# Patient Record
Sex: Male | Born: 1950 | Race: White | Hispanic: No | Marital: Married | Smoking: Former smoker
Health system: Southern US, Community
[De-identification: ages and names within clinical notes are randomized; demographics above are authoritative.]

## PROBLEM LIST (undated history)

## (undated) DIAGNOSIS — A4902 Methicillin resistant Staphylococcus aureus infection, unspecified site: Secondary | ICD-10-CM

## (undated) DIAGNOSIS — I10 Essential (primary) hypertension: Secondary | ICD-10-CM

## (undated) DIAGNOSIS — E785 Hyperlipidemia, unspecified: Secondary | ICD-10-CM

## (undated) DIAGNOSIS — K219 Gastro-esophageal reflux disease without esophagitis: Secondary | ICD-10-CM

## (undated) DIAGNOSIS — F419 Anxiety disorder, unspecified: Secondary | ICD-10-CM

## (undated) DIAGNOSIS — M8580 Other specified disorders of bone density and structure, unspecified site: Secondary | ICD-10-CM

## (undated) DIAGNOSIS — E291 Testicular hypofunction: Secondary | ICD-10-CM

## (undated) HISTORY — DX: Hyperlipidemia, unspecified: E78.5

## (undated) HISTORY — DX: Methicillin resistant Staphylococcus aureus infection, unspecified site: A49.02

## (undated) HISTORY — DX: Anxiety disorder, unspecified: F41.9

## (undated) HISTORY — DX: Gastro-esophageal reflux disease without esophagitis: K21.9

## (undated) HISTORY — DX: Essential (primary) hypertension: I10

## (undated) HISTORY — DX: Other specified disorders of bone density and structure, unspecified site: M85.80

## (undated) HISTORY — PX: OTHER SURGICAL HISTORY: SHX169

## (undated) HISTORY — DX: Testicular hypofunction: E29.1

---

## 2000-12-04 ENCOUNTER — Ambulatory Visit (HOSPITAL_COMMUNITY): Admission: RE | Admit: 2000-12-04 | Discharge: 2000-12-04 | Payer: Self-pay | Admitting: Neurosurgery

## 2000-12-04 ENCOUNTER — Encounter: Payer: Self-pay | Admitting: Neurosurgery

## 2000-12-16 ENCOUNTER — Encounter: Admission: RE | Admit: 2000-12-16 | Discharge: 2001-03-16 | Payer: Self-pay | Admitting: Anesthesiology

## 2001-04-07 ENCOUNTER — Encounter: Admission: RE | Admit: 2001-04-07 | Discharge: 2001-07-06 | Payer: Self-pay | Admitting: Anesthesiology

## 2005-06-02 ENCOUNTER — Ambulatory Visit: Payer: Self-pay | Admitting: Orthopedic Surgery

## 2005-12-31 ENCOUNTER — Ambulatory Visit: Payer: Self-pay | Admitting: Orthopedic Surgery

## 2006-01-08 ENCOUNTER — Ambulatory Visit: Payer: Self-pay | Admitting: Orthopedic Surgery

## 2006-01-15 ENCOUNTER — Ambulatory Visit: Payer: Self-pay | Admitting: Orthopedic Surgery

## 2007-02-08 ENCOUNTER — Ambulatory Visit: Payer: Self-pay | Admitting: Internal Medicine

## 2007-02-15 ENCOUNTER — Ambulatory Visit: Payer: Self-pay | Admitting: Internal Medicine

## 2007-03-25 ENCOUNTER — Ambulatory Visit (HOSPITAL_COMMUNITY): Admission: RE | Admit: 2007-03-25 | Discharge: 2007-03-25 | Payer: Self-pay | Admitting: Internal Medicine

## 2007-03-31 ENCOUNTER — Ambulatory Visit: Payer: Self-pay | Admitting: Internal Medicine

## 2009-01-26 ENCOUNTER — Encounter: Payer: Self-pay | Admitting: Internal Medicine

## 2010-12-06 ENCOUNTER — Emergency Department (HOSPITAL_COMMUNITY)
Admission: EM | Admit: 2010-12-06 | Discharge: 2010-12-06 | Payer: Self-pay | Source: Home / Self Care | Admitting: Emergency Medicine

## 2010-12-08 ENCOUNTER — Emergency Department (HOSPITAL_COMMUNITY)
Admission: EM | Admit: 2010-12-08 | Discharge: 2010-12-08 | Payer: Self-pay | Source: Home / Self Care | Admitting: Emergency Medicine

## 2010-12-10 ENCOUNTER — Emergency Department (HOSPITAL_COMMUNITY)
Admission: EM | Admit: 2010-12-10 | Discharge: 2010-12-10 | Payer: Self-pay | Source: Home / Self Care | Admitting: Emergency Medicine

## 2010-12-10 LAB — CULTURE, ROUTINE-ABSCESS

## 2011-04-04 NOTE — H&P (Signed)
[facility]  Patient:    Robert Davidson, Robert Davidson                          MRN: [id number] Adm. Date:  [id number] Attending:  Thyra Breed CC:         Dr. Viviann Spare ________   History and Physical  FOLLOW-UP EVALUATION:  Eaton Folmar comes in for follow-up evaluation of his right infrascapular pain with underlying history of thoracic degenerative disk disease. Since his last evaluation, we have gotten his pain under fairly good control at rest at 2/10. With activity, he has increasing discomfort.  He has had a dickens of a time getting his disability papers filled out. Dr. ______ put him on disability but would not fill out his papers subsequent to this. Donalee Citrin, Montez Hageman. who had sent him to [place] would not fill out the forms then. The patient was made aware that it is our policy not to fill out forms; but since he basically will not get paid if somebody does not do it for him, I would fill out this one time. He really needs to have the person who put him on disability filling out the forms, i.e. Dr. ______ . I explained this to him.  PHYSICAL EXAMINATION:  VITAL SIGNS:  Blood pressure is 138/80, heart rate is 87, respiratory rate is 18, O2 saturation is 98%, pain level is 2/10.  NEUROLOGICAL:  He has pain in the right infrascapular region to palpation. Deep tendon reflexes are symmetric in the upper extremities.  CURRENT MEDICATIONS: 1. OxyContin 20 mg q.8h. 2. Zestril and Wellbutrin.  IMPRESSION: 1. Right infrascapular pain with underlying thoracic degenerative disk disease    improved at rest but with pain with activity. 2. Hypertension per Dr. ______ .  DISPOSITION: 1. Increase OxyContin to 40 mg one p.o. q.12h., #60 with no refill. 2. I filled out his disability forms today and advised him that he needs to    have these subsequently filled out by Dr. ______ and would be happy to    send him a copy of our results and let him know when we feel like we are  getting improvements. 3. Continue medications for blood pressure per Dr. ______ . 4. Follow up with me in 8 weeks. He is to stop by in 3 weeks for another    prescription of his OxyContin. DD:  02/11/01 TD:  02/11/01 Job: [id number]
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
## 2011-04-04 NOTE — H&P (Signed)
[facility]  Patient:    Robert Davidson, Robert Davidson                          MRN: [id number] Adm. Date:  [id number] Attending:  Thyra Breed CC:         Dr. Sheran Lawless., M.D.   History and Physical  FOLLOWUP EVALUATION  HISTORY OF PRESENT ILLNESS:  Robert Davidson comes in for follow-up evaluation of his right infrascapular pain syndrome.  Since his last evaluation he has returned to work and notes that the medicine lasts until about 2 oclock, and he is really having to crank up his TENS unit.  He is able to continue with work. He rates his pain 3/10, but he notes he breaks through fairly significantly late in the afternoon.  He has had minimal side effects of the medication.  CURRENT MEDICATIONS: 1. Zestril. 2. Wellbutrin. 3. OxyContin 40 mg 2 x q.d.  PHYSICAL EXAMINATION:  VITAL SIGNS:  Blood pressure 137/98, heart rate 105, respiratory rate 16, O2 saturation 98%.  Pain level 3/10.  NEUROLOGIC:  Deep tendon reflexes were symmetric in the upper extremity.  He has tenderness along the medial aspect of the right scapula.  IMPRESSION: 1. Right infrascapular pain with underlying thoracic degenerative disc    disease. 2. Hypertension per Dr. Metta Clines.  DISPOSITION: 1. Increase OxyContin to 40 mg 1 p.o. q.8h., #90 with no refill. 2. Followup with me in eight weeks. 3. Continue with blood pressure medications per Dr. Metta Clines.  He is to let us    know if he is intolerant of the medications between now and his next    appointment.  I encouraged him to keep using the TENS unit since he is    having a very positive response to this. DD:  04/08/01 TD:  04/08/01 Job: [id number]

## 2011-04-04 NOTE — H&P (Signed)
[facility]  Patient:    Robert Davidson, Robert Davidson                          MRN: [id number] Adm. Date:  [id number] Attending:  Thyra Breed CC:         Donzetta Sprung. Roney Jaffe., M.D.  John Giovanni, M.D.   History and Physical  FOLLOWUP EVALUATION:  The patient comes in for followup evaluation of his thoracic spine discomfort.  Since his previous evaluation, the patient has noted that his pain is less unless he is active.  He is tolerating the OxyContin 10 mg q.8h. without difficulty.  He notes minimal side-effects to the medication.  Unfortunately, if he picks up his granddaughter, his pain is exacerbated.  He has no pain that radiates around into the anterior chest wall; his pain is localized to the right paravertebral region just medial to the distal aspect of the scapula at approximately the T8 line.  He is very curious about endpoints and I told him I could not predict things at this point.  His pain character is unchanged; it is more of a toothache-type discomfort.  CURRENT MEDICATIONS:  Zestril, Valium, Wellbutrin and OxyContin.  PHYSICAL EXAMINATION  VITAL SIGNS:  Blood pressure is 121/84, heart rate is 100, respiratory rate is 18, O2 saturation is 97% and pain level is 4/10.  CHEST:  He demonstrates a localized pain to the right medial scapular region with no radiation anteriorly.  Sensory exam to scratch sense and pinprick are intact.  Lungs are clear.  IMPRESSION:  Right infrascapular discomfort with an underlying thoracic degenerative disk disease.  DISPOSITION 1. Increase OxyContin to 20 mg 1 p.o. b.i.d., #60 with no refill. 2. Trial of TENS unit, prescription written for this. 3. Follow up with me in four weeks.  If he is not continuing to improve, I    advised him that we could consider an epidural steroid injection, but for    the time-being, I wish to try medical management. DD:  01/14/01 TD:  01/14/01 Job: [id number]

## 2011-04-04 NOTE — Assessment & Plan Note (Signed)
[facility]                         GASTROENTEROLOGY OFFICE NOTE   CAMDEN, KNOTEK                          MRN:          [id number]  DATE:02/15/2007                            DOB:          Aug 23, 1951    Mr. Mella is a 60 year old gentleman who is scheduled for a screening  colonoscopy for this week, April 3, in [facility] using conscious sedation.  The reason for his being seen in the office today is his concern for  sedation.  Mr. Chillemi has been on OxyContin 80 mg three times a day for  chronic back pain.  He is concerned that he will not be able to be  sedated because his pain threshold has been altered.   I have talked to the patient extensively today and it would be  preferable to do his colonoscopy under propofol anesthetic.  This will  have to be done in the hospital as an outpatient most likely at [facility] and the earliest possible date will be in May.  The patient was agreeable to that and we have scheduled him for propofol  colonoscopy in May of 2008.     Hedwig Morton. Juanda Chance, MD  Electronically Signed    DMB/MedQ  DD: 02/15/2007  DT: 02/15/2007  Job #: [id number]

## 2011-04-04 NOTE — H&P (Signed)
[facility]  Patient:    Robert Davidson, Robert Davidson                          MRN: [id number] Adm. Date:  [id number] Attending:  Thyra Breed CC:         Donzetta Sprung. Roney Jaffe., M.D.  Dr. Sudie Bailey   History and Physical  FOLLOW-UP EVALUATION  [name] comes in for follow-up evaluation of his thoracic degenerative disk disease and right infrascapular pain.  Since his last evaluation, the patient has been back at work and he notes that he is able to work, but on certain jobs, he has some exacerbations of his pain.  He does feel as though the OxyContin 40 mg three times a day is helpful.  He has not noted any sedation or limits from this.  He does feel as though he may benefit from going up on the dose of this.  He feels pressured at work, as though they are trying to push him out of work at the present time.  PHYSICAL EXAMINATION:  VITAL SIGNS:  Blood pressure 141/87, heart rate is 97, respiratory rate is 18, O2 saturations 97%, pain level is 2/10.  EXTREMITIES/NEUROLOGIC:  Deep tendon reflexes were symmetric in the upper and lower extremities.  He exhibited tenderness along the medial aspect of the right scapula.  IMPRESSION: 1. Right infrascapular pain with underlying thoracic degenerative disk    disease. 2. Hypertension per Dr. Sudie Bailey.  DISPOSITION: 1. Increase OxyContin to 40 mg one in the morning and one at midday and two in    the evening, #120 with no refill. 2. The patient apparently is being denied approval of his TENS unit through    [facility].  I advised the patient that it appears to me that    if he did not have the TENS unit, he would not be able to work at all, as    it has significantly reduced his discomfort, and I do not understand the    question as to whether it should be approved or not.  I advised him I would    write a letter in support of this. 3. Follow up with me in 4-8 weeks. DD:  06/03/01 TD:  06/03/01 Job:  [id number]

## 2011-12-25 DIAGNOSIS — Z79899 Other long term (current) drug therapy: Secondary | ICD-10-CM | POA: Diagnosis not present

## 2011-12-25 DIAGNOSIS — M546 Pain in thoracic spine: Secondary | ICD-10-CM | POA: Diagnosis not present

## 2011-12-25 DIAGNOSIS — M542 Cervicalgia: Secondary | ICD-10-CM | POA: Diagnosis not present

## 2011-12-25 DIAGNOSIS — G541 Lumbosacral plexus disorders: Secondary | ICD-10-CM | POA: Diagnosis not present

## 2011-12-25 DIAGNOSIS — F341 Dysthymic disorder: Secondary | ICD-10-CM | POA: Diagnosis not present

## 2012-01-29 DIAGNOSIS — L03211 Cellulitis of face: Secondary | ICD-10-CM | POA: Diagnosis not present

## 2012-01-29 DIAGNOSIS — L0201 Cutaneous abscess of face: Secondary | ICD-10-CM | POA: Diagnosis not present

## 2012-01-30 DIAGNOSIS — F329 Major depressive disorder, single episode, unspecified: Secondary | ICD-10-CM | POA: Insufficient documentation

## 2012-01-30 DIAGNOSIS — M549 Dorsalgia, unspecified: Secondary | ICD-10-CM

## 2012-01-30 DIAGNOSIS — I10 Essential (primary) hypertension: Secondary | ICD-10-CM | POA: Insufficient documentation

## 2012-02-02 ENCOUNTER — Encounter: Payer: Self-pay | Admitting: Infectious Disease

## 2012-02-02 ENCOUNTER — Ambulatory Visit (INDEPENDENT_AMBULATORY_CARE_PROVIDER_SITE_OTHER): Payer: BC Managed Care – PPO | Admitting: Infectious Disease

## 2012-02-02 VITALS — BP 138/89 | HR 112 | Temp 97.9°F | Wt 205.0 lb

## 2012-02-02 DIAGNOSIS — E291 Testicular hypofunction: Secondary | ICD-10-CM | POA: Diagnosis not present

## 2012-02-02 DIAGNOSIS — A4902 Methicillin resistant Staphylococcus aureus infection, unspecified site: Secondary | ICD-10-CM | POA: Diagnosis not present

## 2012-02-02 DIAGNOSIS — M810 Age-related osteoporosis without current pathological fracture: Secondary | ICD-10-CM | POA: Insufficient documentation

## 2012-02-02 DIAGNOSIS — I10 Essential (primary) hypertension: Secondary | ICD-10-CM | POA: Insufficient documentation

## 2012-02-02 LAB — HIV ANTIBODY (ROUTINE TESTING W REFLEX): HIV: NONREACTIVE

## 2012-02-02 MED ORDER — MUPIROCIN 2 % EX OINT: TOPICAL_OINTMENT | CUTANEOUS | Status: AC

## 2012-02-02 MED ORDER — CHLORHEXIDINE GLUCONATE 4 % EX LIQD: 60.0000 mL | Freq: Every day | CUTANEOUS | Status: AC | PRN

## 2012-02-02 NOTE — Progress Notes (Signed)
Subjective:    Patient ID: Robert Davidson, male    DOB: 06/20/1951, 61 y.o.   MRN: [id number]  HPI  61 year old man with hx of osteopenia, hypogonadism, hyperlipidemia who has had recurrence of MRSA (3 episodes). First episode was in perineal area and required I and D in ED at [facility]. He had then boil on scalp I and D at PCP with MRSA growing, and now again within the past 2 weeks. He has been shaving his hair with electric razor in region where the past 2 boils have come up. He also has noticed plugging of hair follicles in weeks that he takes the testosterone. He has tried hibiclenz baths with IN mupirocin. He has no current sexual partners and has not been sexual active for 10 years. He has no hx of recurrent childhood infections of skin or soft tissue or sinus infection. I have recommended that he do bleach baths plus IN mupirocin but he is reluctant to try this preferring to go back to hibiclenz and mupirocin first (he also tried rifampin but did not tolerate this. He has been rx surfaces with chlorox. His dog has not been thru decolonization regimen yet. I have also asked him to make sure that his razor blade guides are sterile before using them and to consider purchase of new electric razor head and reduce frequency of shaving      Review of Systems  Constitutional: Negative for fever, chills, diaphoresis, activity change, appetite change, fatigue and unexpected weight change.  HENT: Negative for congestion, sore throat, rhinorrhea, sneezing, trouble swallowing and sinus pressure.   Eyes: Negative for photophobia and visual disturbance.  Respiratory: Negative for cough, chest tightness, shortness of breath, wheezing and stridor.   Cardiovascular: Negative for chest pain, palpitations and leg swelling.  Gastrointestinal: Negative for nausea, vomiting, abdominal pain, diarrhea, constipation, blood in stool, abdominal distention and anal bleeding.  Genitourinary: Negative for dysuria, hematuria,  flank pain and difficulty urinating.  Musculoskeletal: Negative for myalgias, back pain, joint swelling, arthralgias and gait problem.  Skin: Positive for wound. Negative for color change, pallor and rash.  Neurological: Negative for dizziness, tremors, weakness and light-headedness.  Hematological: Negative for adenopathy. Does not bruise/bleed easily.  Psychiatric/Behavioral: Negative for behavioral problems, confusion, sleep disturbance, dysphoric mood, decreased concentration and agitation.       Objective:   Physical Exam  Constitutional: He is oriented to person, place, and time. He appears well-developed and well-nourished. No distress.  HENT:  Head: Normocephalic and atraumatic.    Mouth/Throat: Oropharynx is clear and moist. No oropharyngeal exudate.  Eyes: Conjunctivae and EOM are normal. Pupils are equal, round, and reactive to light. No scleral icterus.  Neck: Normal range of motion. Neck supple. No JVD present.  Cardiovascular: Normal rate, regular rhythm and normal heart sounds.  Exam reveals no gallop and no friction rub.   No murmur heard. Pulmonary/Chest: Effort normal and breath sounds normal. No respiratory distress. He has no wheezes. He has no rales. He exhibits no tenderness.  Abdominal: He exhibits no distension and no mass. There is no tenderness. There is no rebound and no guarding.  Musculoskeletal: He exhibits no edema and no tenderness.  Lymphadenopathy:    He has no cervical adenopathy.  Neurological: He is alert and oriented to person, place, and time. He has normal reflexes. He exhibits normal muscle tone. Coordination normal.  Skin: Skin is warm and dry. He is not diaphoretic. There is erythema. No pallor.  Psychiatric: He has  a normal mood and affect. His behavior is normal. Judgment and thought content normal.          Assessment & Plan:  MRSA infection Finish current oral abx. Try to reduce frequency of shaving, get sterile shaving guider,  replace heads, try repeat hibiclenz and mupirocin. Should really give more thought to bleach baths and even consider hibiclenz the dog too. RTC in 3 months. Will screen for HIV. No reason to suspect other heritable or acquired immune problems given hx.  Hypogonadism male Suggested other options for administration of theis med but he feels the injectablve means is best for him

## 2012-02-02 NOTE — Assessment & Plan Note (Signed)
Suggested other options for administration of theis med but he feels the injectablve means is best for him

## 2012-02-02 NOTE — Assessment & Plan Note (Addendum)
Finish current oral abx. Try to reduce frequency of shaving, get sterile shaving guider, replace heads, try repeat hibiclenz and mupirocin. Should really give more thought to bleach baths and even consider hibiclenz the dog too. RTC in 3 months. Will screen for HIV. No reason to suspect other heritable or acquired immune problems given hx.

## 2012-02-18 DIAGNOSIS — G541 Lumbosacral plexus disorders: Secondary | ICD-10-CM | POA: Diagnosis not present

## 2012-02-18 DIAGNOSIS — M546 Pain in thoracic spine: Secondary | ICD-10-CM | POA: Diagnosis not present

## 2012-02-18 DIAGNOSIS — Z79899 Other long term (current) drug therapy: Secondary | ICD-10-CM | POA: Diagnosis not present

## 2012-02-18 DIAGNOSIS — F341 Dysthymic disorder: Secondary | ICD-10-CM | POA: Diagnosis not present

## 2012-02-28 ENCOUNTER — Encounter (HOSPITAL_COMMUNITY): Payer: Self-pay

## 2012-02-28 ENCOUNTER — Emergency Department (HOSPITAL_COMMUNITY): Payer: Medicare Other

## 2012-02-28 ENCOUNTER — Emergency Department (HOSPITAL_COMMUNITY)
Admission: EM | Admit: 2012-02-28 | Discharge: 2012-02-28 | Disposition: A | Discharge status: Home / Self Care | Payer: Medicare Other | Attending: Emergency Medicine | Admitting: Emergency Medicine

## 2012-02-28 DIAGNOSIS — S300XXA Contusion of lower back and pelvis, initial encounter: Secondary | ICD-10-CM | POA: Diagnosis not present

## 2012-02-28 DIAGNOSIS — R079 Chest pain, unspecified: Secondary | ICD-10-CM | POA: Diagnosis not present

## 2012-02-28 DIAGNOSIS — R221 Localized swelling, mass and lump, neck: Secondary | ICD-10-CM | POA: Diagnosis not present

## 2012-02-28 DIAGNOSIS — M810 Age-related osteoporosis without current pathological fracture: Secondary | ICD-10-CM | POA: Diagnosis not present

## 2012-02-28 DIAGNOSIS — S2239XA Fracture of one rib, unspecified side, initial encounter for closed fracture: Secondary | ICD-10-CM | POA: Diagnosis not present

## 2012-02-28 DIAGNOSIS — IMO0002 Reserved for concepts with insufficient information to code with codable children: Secondary | ICD-10-CM | POA: Insufficient documentation

## 2012-02-28 DIAGNOSIS — M47812 Spondylosis without myelopathy or radiculopathy, cervical region: Secondary | ICD-10-CM | POA: Diagnosis not present

## 2012-02-28 DIAGNOSIS — R109 Unspecified abdominal pain: Secondary | ICD-10-CM | POA: Diagnosis not present

## 2012-02-28 DIAGNOSIS — Q619 Cystic kidney disease, unspecified: Secondary | ICD-10-CM | POA: Diagnosis not present

## 2012-02-28 DIAGNOSIS — T07XXXA Unspecified multiple injuries, initial encounter: Secondary | ICD-10-CM

## 2012-02-28 DIAGNOSIS — R220 Localized swelling, mass and lump, head: Secondary | ICD-10-CM | POA: Insufficient documentation

## 2012-02-28 DIAGNOSIS — R10812 Left upper quadrant abdominal tenderness: Secondary | ICD-10-CM | POA: Diagnosis not present

## 2012-02-28 DIAGNOSIS — Z79899 Other long term (current) drug therapy: Secondary | ICD-10-CM | POA: Diagnosis not present

## 2012-02-28 DIAGNOSIS — S298XXA Other specified injuries of thorax, initial encounter: Secondary | ICD-10-CM | POA: Diagnosis not present

## 2012-02-28 DIAGNOSIS — R071 Chest pain on breathing: Secondary | ICD-10-CM | POA: Diagnosis not present

## 2012-02-28 DIAGNOSIS — M25529 Pain in unspecified elbow: Secondary | ICD-10-CM | POA: Insufficient documentation

## 2012-02-28 DIAGNOSIS — S2231XA Fracture of one rib, right side, initial encounter for closed fracture: Secondary | ICD-10-CM

## 2012-02-28 DIAGNOSIS — I10 Essential (primary) hypertension: Secondary | ICD-10-CM | POA: Diagnosis not present

## 2012-02-28 DIAGNOSIS — T1490XA Injury, unspecified, initial encounter: Secondary | ICD-10-CM | POA: Diagnosis not present

## 2012-02-28 LAB — RAPID URINE DRUG SCREEN, HOSP PERFORMED
Amphetamines: NOT DETECTED
Barbiturates: NOT DETECTED
Benzodiazepines: POSITIVE — AB
Cocaine: NOT DETECTED
Opiates: NOT DETECTED
Tetrahydrocannabinol: NOT DETECTED

## 2012-02-28 LAB — TYPE AND SCREEN
ABO/RH(D): B POS
Antibody Screen: NEGATIVE

## 2012-02-28 LAB — URINALYSIS, ROUTINE W REFLEX MICROSCOPIC
Bilirubin Urine: NEGATIVE
Glucose, UA: NEGATIVE mg/dL
Hgb urine dipstick: NEGATIVE
Ketones, ur: NEGATIVE mg/dL
Nitrite: NEGATIVE
Protein, ur: NEGATIVE mg/dL
Specific Gravity, Urine: 1.018 (ref 1.005–1.030)
Urobilinogen, UA: 0.2 mg/dL (ref 0.0–1.0)
pH: 7 (ref 5.0–8.0)

## 2012-02-28 LAB — CBC
HCT: 48.8 % (ref 39.0–52.0)
Hemoglobin: 16.7 g/dL (ref 13.0–17.0)
MCH: 28.4 pg (ref 26.0–34.0)
MCHC: 34.2 g/dL (ref 30.0–36.0)
MCV: 83.1 fL (ref 78.0–100.0)
Platelets: 215 10*3/uL (ref 150–400)
RBC: 5.87 MIL/uL — ABNORMAL HIGH (ref 4.22–5.81)
RDW: 12.6 % (ref 11.5–15.5)
WBC: 12 10*3/uL — ABNORMAL HIGH (ref 4.0–10.5)

## 2012-02-28 LAB — ETHANOL: Alcohol, Ethyl (B): <11 mg/dL (ref 0–11)

## 2012-02-28 LAB — PROTIME-INR
INR: 1.03 (ref 0.00–1.49)
Prothrombin Time: 13.7 seconds (ref 11.6–15.2)

## 2012-02-28 LAB — ABO/RH: ABO/RH(D): B POS

## 2012-02-28 LAB — URINE MICROSCOPIC-ADD ON

## 2012-02-28 LAB — LACTIC ACID, PLASMA: Lactic Acid, Venous: 0.9 mmol/L (ref 0.5–2.2)

## 2012-02-28 MED ORDER — TETANUS-DIPHTH-ACELL PERTUSSIS 5-2.5-18.5 LF-MCG/0.5 IM SUSP
0.5000 mL | Freq: Once | INTRAMUSCULAR | Status: AC
  Administered 2012-02-28: 0.5 mL via INTRAMUSCULAR
  Filled 2012-02-28: qty 0.5

## 2012-02-28 MED ORDER — IOHEXOL 300 MG/ML  SOLN
100.0000 mL | Freq: Once | INTRAMUSCULAR | Status: AC | PRN
  Administered 2012-02-28: 100 mL via INTRAVENOUS

## 2012-02-28 MED ORDER — TETANUS-DIPHTH-ACELL PERTUSSIS 5-2.5-18.5 LF-MCG/0.5 IM SUSP: 0.5000 mL | Freq: Once | INTRAMUSCULAR | Status: DC

## 2012-02-28 MED ORDER — HYDROMORPHONE HCL PF 1 MG/ML IJ SOLN
INTRAMUSCULAR | Status: AC
  Administered 2012-02-28: 1 mg via INTRAVENOUS
  Filled 2012-02-28: qty 1

## 2012-02-28 MED ORDER — ONDANSETRON HCL 4 MG/2ML IJ SOLN
4.0000 mg | Freq: Once | INTRAMUSCULAR | Status: AC
  Administered 2012-02-28: 4 mg via INTRAVENOUS
  Filled 2012-02-28: qty 2

## 2012-02-28 MED ORDER — HYDROMORPHONE HCL PF 1 MG/ML IJ SOLN: 1.0000 mg | Freq: Once | INTRAMUSCULAR | Status: DC

## 2012-02-28 MED ORDER — HYDROMORPHONE HCL PF 1 MG/ML IJ SOLN
INTRAMUSCULAR | Status: AC
  Filled 2012-02-28: qty 1

## 2012-02-28 MED ORDER — OXYCODONE HCL 5 MG PO TABS: 5.0000 mg | ORAL_TABLET | ORAL | Status: DC | PRN

## 2012-02-28 MED ORDER — SODIUM CHLORIDE 0.9 % IV BOLUS (SEPSIS): 1000.0000 mL | Freq: Once | INTRAVENOUS | Status: DC

## 2012-02-28 MED ORDER — HYDROMORPHONE HCL PF 1 MG/ML IJ SOLN
1.0000 mg | Freq: Once | INTRAMUSCULAR | Status: AC
  Administered 2012-02-28: 1 mg via INTRAVENOUS

## 2012-02-28 MED ORDER — SODIUM CHLORIDE 0.9 % IV SOLN: Freq: Once | INTRAVENOUS | Status: DC

## 2012-02-28 MED ORDER — FENTANYL CITRATE 0.05 MG/ML IJ SOLN
100.0000 ug | Freq: Once | INTRAMUSCULAR | Status: AC
  Administered 2012-02-28: 100 ug via INTRAVENOUS
  Filled 2012-02-28: qty 2

## 2012-02-28 MED ORDER — HYDROMORPHONE HCL PF 1 MG/ML IJ SOLN
1.0000 mg | Freq: Once | INTRAMUSCULAR | Status: AC
Start: 2012-02-28 — End: 2012-02-28
  Administered 2012-02-28: 1 mg via INTRAVENOUS

## 2012-02-28 MED ORDER — MORPHINE SULFATE 4 MG/ML IJ SOLN: 4.0000 mg | Freq: Once | INTRAMUSCULAR | Status: DC

## 2012-02-28 NOTE — ED Notes (Signed)
Patient given incentive spirometer and instructed on how to use it at home.
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
## 2012-02-28 NOTE — ED Notes (Signed)
Received bedside report from [place], [place].  Patient currently lying in bed; no respiratory or acute distress noted.  Patient updated on plan of care; informed patient that we need to clean wounds/abrasions.  Patient requesting to have C-collar removed; resident notified.  Patient has no other questions or concerns at this time; will continue to monitor.

## 2012-02-28 NOTE — ED Notes (Signed)
Pt returned to room 2  After CT and xrays, reports mild pain control with the dilaudid

## 2012-02-28 NOTE — ED Notes (Signed)
Pt to xray and CT

## 2012-02-28 NOTE — ED Notes (Signed)
Patient given copy of discharge paperwork; went over discharge instructions with patient.  Patient instructed to take oxycodone as directed, to not drive while taking pain medication, to follow up with his primary care physician within the next week, to use incentive spirometer at home, and to return to the ED for new, worsening, or concerning symptoms.

## 2012-02-28 NOTE — Progress Notes (Addendum)
Chaplain Note: Chaplain responded immediately to LV 2 trauma page.  Chaplain provided spiritual comfort, support, and prayer for pt.  At pt's request, chaplain notifed pt's emergency contact, informing him that the pt was being treated at [facility] and had requested that I call.  Chaplain will follow up if needed.  Pt expressed appreciation for chaplain support.   02/28/12 1500  Clinical Encounter Type  Visited With Patient  Visit Type Spiritual support;ED  Referral From Other (Comment) (Trauma Page)  Spiritual Encounters  Spiritual Needs Prayer;Emotional  Stress Factors  Patient Stress Factors Health changes  Family Stress Factors Other (Comment) (No family present)    Verdie Shire, chaplain resident [phone]
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
## 2012-03-04 ENCOUNTER — Emergency Department (HOSPITAL_COMMUNITY): Payer: Medicare Other

## 2012-03-04 ENCOUNTER — Inpatient Hospital Stay (HOSPITAL_COMMUNITY)
Admission: EM | Admit: 2012-03-04 | Discharge: 2012-03-08 | DRG: 557 | Disposition: A | Discharge status: Home / Self Care | Payer: Medicare Other | Source: Ambulatory Visit | Attending: Internal Medicine | Admitting: Internal Medicine

## 2012-03-04 ENCOUNTER — Encounter (HOSPITAL_COMMUNITY): Payer: Self-pay | Admitting: *Deleted

## 2012-03-04 DIAGNOSIS — E871 Hypo-osmolality and hyponatremia: Secondary | ICD-10-CM | POA: Diagnosis present

## 2012-03-04 DIAGNOSIS — I6789 Other cerebrovascular disease: Secondary | ICD-10-CM | POA: Diagnosis not present

## 2012-03-04 DIAGNOSIS — G928 Other toxic encephalopathy: Secondary | ICD-10-CM | POA: Diagnosis present

## 2012-03-04 DIAGNOSIS — N179 Acute kidney failure, unspecified: Secondary | ICD-10-CM | POA: Diagnosis present

## 2012-03-04 DIAGNOSIS — G929 Unspecified toxic encephalopathy: Secondary | ICD-10-CM | POA: Diagnosis present

## 2012-03-04 DIAGNOSIS — I10 Essential (primary) hypertension: Secondary | ICD-10-CM | POA: Diagnosis present

## 2012-03-04 DIAGNOSIS — D72829 Elevated white blood cell count, unspecified: Secondary | ICD-10-CM | POA: Diagnosis present

## 2012-03-04 DIAGNOSIS — E291 Testicular hypofunction: Secondary | ICD-10-CM | POA: Diagnosis present

## 2012-03-04 DIAGNOSIS — M810 Age-related osteoporosis without current pathological fracture: Secondary | ICD-10-CM | POA: Diagnosis present

## 2012-03-04 DIAGNOSIS — M6282 Rhabdomyolysis: Principal | ICD-10-CM | POA: Diagnosis present

## 2012-03-04 DIAGNOSIS — S2239XA Fracture of one rib, unspecified side, initial encounter for closed fracture: Secondary | ICD-10-CM | POA: Diagnosis not present

## 2012-03-04 DIAGNOSIS — E876 Hypokalemia: Secondary | ICD-10-CM | POA: Diagnosis not present

## 2012-03-04 DIAGNOSIS — B179 Acute viral hepatitis, unspecified: Secondary | ICD-10-CM | POA: Diagnosis present

## 2012-03-04 DIAGNOSIS — G92 Toxic encephalopathy: Secondary | ICD-10-CM | POA: Diagnosis present

## 2012-03-04 DIAGNOSIS — M549 Dorsalgia, unspecified: Secondary | ICD-10-CM | POA: Diagnosis present

## 2012-03-04 DIAGNOSIS — G8929 Other chronic pain: Secondary | ICD-10-CM | POA: Diagnosis present

## 2012-03-04 DIAGNOSIS — R4182 Altered mental status, unspecified: Secondary | ICD-10-CM | POA: Diagnosis not present

## 2012-03-04 DIAGNOSIS — S3011XA Contusion of abdominal wall, initial encounter: Secondary | ICD-10-CM | POA: Diagnosis not present

## 2012-03-04 DIAGNOSIS — Z79899 Other long term (current) drug therapy: Secondary | ICD-10-CM

## 2012-03-04 DIAGNOSIS — R443 Hallucinations, unspecified: Secondary | ICD-10-CM | POA: Diagnosis not present

## 2012-03-04 DIAGNOSIS — R410 Disorientation, unspecified: Secondary | ICD-10-CM | POA: Diagnosis present

## 2012-03-04 DIAGNOSIS — D649 Anemia, unspecified: Secondary | ICD-10-CM | POA: Diagnosis not present

## 2012-03-04 DIAGNOSIS — S301XXA Contusion of abdominal wall, initial encounter: Secondary | ICD-10-CM | POA: Diagnosis not present

## 2012-03-04 LAB — URINALYSIS, ROUTINE W REFLEX MICROSCOPIC
Bilirubin Urine: NEGATIVE
Nitrite: NEGATIVE
Specific Gravity, Urine: <1.005 — ABNORMAL LOW (ref 1.005–1.030)
pH: 6 (ref 5.0–8.0)

## 2012-03-04 LAB — SAMPLE TO BLOOD BANK

## 2012-03-04 LAB — DIFFERENTIAL
Basophils Absolute: 0 10*3/uL (ref 0.0–0.1)
Basophils Relative: 0 % (ref 0–1)
Neutro Abs: 16.9 10*3/uL — ABNORMAL HIGH (ref 1.7–7.7)
Neutrophils Relative %: 88 % — ABNORMAL HIGH (ref 43–77)

## 2012-03-04 LAB — CBC
MCHC: 35.9 g/dL (ref 30.0–36.0)
Platelets: 294 10*3/uL (ref 150–400)
RDW: 12.8 % (ref 11.5–15.5)

## 2012-03-04 LAB — COMPREHENSIVE METABOLIC PANEL
AST: 932 U/L — ABNORMAL HIGH (ref 0–37)
Albumin: 3.3 g/dL — ABNORMAL LOW (ref 3.5–5.2)
Alkaline Phosphatase: 73 U/L (ref 39–117)
Chloride: 87 mEq/L — ABNORMAL LOW (ref 96–112)
Creatinine, Ser: 2.21 mg/dL — ABNORMAL HIGH (ref 0.50–1.35)
Potassium: 4.2 mEq/L (ref 3.5–5.1)
Total Bilirubin: 0.7 mg/dL (ref 0.3–1.2)

## 2012-03-04 LAB — URINE MICROSCOPIC-ADD ON

## 2012-03-04 MED ORDER — SODIUM CHLORIDE 0.9 % IV BOLUS (SEPSIS)
1000.0000 mL | Freq: Once | INTRAVENOUS | Status: AC
  Administered 2012-03-04: 1000 mL via INTRAVENOUS

## 2012-03-04 MED ORDER — LORAZEPAM 2 MG/ML IJ SOLN
1.0000 mg | Freq: Once | INTRAMUSCULAR | Status: AC
  Administered 2012-03-04: 1 mg via INTRAVENOUS
  Filled 2012-03-04: qty 1
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
## 2012-03-05 ENCOUNTER — Encounter (HOSPITAL_COMMUNITY): Payer: Self-pay | Admitting: Internal Medicine

## 2012-03-05 DIAGNOSIS — Z79899 Other long term (current) drug therapy: Secondary | ICD-10-CM | POA: Diagnosis not present

## 2012-03-05 DIAGNOSIS — G929 Unspecified toxic encephalopathy: Secondary | ICD-10-CM | POA: Diagnosis not present

## 2012-03-05 DIAGNOSIS — D649 Anemia, unspecified: Secondary | ICD-10-CM | POA: Diagnosis not present

## 2012-03-05 DIAGNOSIS — D72829 Elevated white blood cell count, unspecified: Secondary | ICD-10-CM | POA: Diagnosis present

## 2012-03-05 DIAGNOSIS — E291 Testicular hypofunction: Secondary | ICD-10-CM | POA: Diagnosis not present

## 2012-03-05 DIAGNOSIS — S301XXA Contusion of abdominal wall, initial encounter: Secondary | ICD-10-CM | POA: Diagnosis not present

## 2012-03-05 DIAGNOSIS — G9341 Metabolic encephalopathy: Secondary | ICD-10-CM | POA: Diagnosis not present

## 2012-03-05 DIAGNOSIS — M6282 Rhabdomyolysis: Secondary | ICD-10-CM | POA: Diagnosis not present

## 2012-03-05 DIAGNOSIS — M810 Age-related osteoporosis without current pathological fracture: Secondary | ICD-10-CM | POA: Diagnosis not present

## 2012-03-05 DIAGNOSIS — G8929 Other chronic pain: Secondary | ICD-10-CM | POA: Diagnosis not present

## 2012-03-05 DIAGNOSIS — G92 Toxic encephalopathy: Secondary | ICD-10-CM | POA: Diagnosis not present

## 2012-03-05 DIAGNOSIS — R4182 Altered mental status, unspecified: Secondary | ICD-10-CM | POA: Diagnosis not present

## 2012-03-05 DIAGNOSIS — E871 Hypo-osmolality and hyponatremia: Secondary | ICD-10-CM | POA: Diagnosis not present

## 2012-03-05 DIAGNOSIS — S2239XA Fracture of one rib, unspecified side, initial encounter for closed fracture: Secondary | ICD-10-CM | POA: Diagnosis not present

## 2012-03-05 DIAGNOSIS — N179 Acute kidney failure, unspecified: Secondary | ICD-10-CM | POA: Diagnosis not present

## 2012-03-05 DIAGNOSIS — R410 Disorientation, unspecified: Secondary | ICD-10-CM | POA: Diagnosis present

## 2012-03-05 DIAGNOSIS — M549 Dorsalgia, unspecified: Secondary | ICD-10-CM | POA: Diagnosis not present

## 2012-03-05 DIAGNOSIS — E876 Hypokalemia: Secondary | ICD-10-CM | POA: Diagnosis not present

## 2012-03-05 DIAGNOSIS — T795XXA Traumatic anuria, initial encounter: Secondary | ICD-10-CM | POA: Diagnosis not present

## 2012-03-05 DIAGNOSIS — F05 Delirium due to known physiological condition: Secondary | ICD-10-CM | POA: Diagnosis not present

## 2012-03-05 DIAGNOSIS — B179 Acute viral hepatitis, unspecified: Secondary | ICD-10-CM | POA: Diagnosis present

## 2012-03-05 DIAGNOSIS — I10 Essential (primary) hypertension: Secondary | ICD-10-CM | POA: Diagnosis not present

## 2012-03-05 LAB — RAPID URINE DRUG SCREEN, HOSP PERFORMED
Cocaine: NOT DETECTED
Opiates: NOT DETECTED
Tetrahydrocannabinol: NOT DETECTED

## 2012-03-05 LAB — CBC
HCT: 38 % — ABNORMAL LOW (ref 39.0–52.0)
MCH: 28.2 pg (ref 26.0–34.0)
MCV: 78.8 fL (ref 78.0–100.0)
RDW: 12.9 % (ref 11.5–15.5)
WBC: 15 10*3/uL — ABNORMAL HIGH (ref 4.0–10.5)

## 2012-03-05 LAB — COMPREHENSIVE METABOLIC PANEL
Albumin: 2.9 g/dL — ABNORMAL LOW (ref 3.5–5.2)
BUN: 65 mg/dL — ABNORMAL HIGH (ref 6–23)
CO2: 24 mEq/L (ref 19–32)
Calcium: 9.2 mg/dL (ref 8.4–10.5)
Chloride: 97 mEq/L (ref 96–112)
Creatinine, Ser: 1.98 mg/dL — ABNORMAL HIGH (ref 0.50–1.35)
GFR calc non Af Amer: 35 mL/min — ABNORMAL LOW (ref >90)
Total Bilirubin: 0.8 mg/dL (ref 0.3–1.2)

## 2012-03-05 LAB — ACETAMINOPHEN LEVEL: Acetaminophen (Tylenol), Serum: <15 ug/mL (ref 10–30)

## 2012-03-05 LAB — T4, FREE: Free T4: 0.88 ng/dL (ref 0.80–1.80)

## 2012-03-05 LAB — TSH: TSH: 0.334 u[IU]/mL — ABNORMAL LOW (ref 0.350–4.500)

## 2012-03-05 LAB — PROTIME-INR
INR: 1.22 (ref 0.00–1.49)
Prothrombin Time: 15.7 seconds — ABNORMAL HIGH (ref 11.6–15.2)

## 2012-03-05 LAB — APTT: aPTT: 34 seconds (ref 24–37)

## 2012-03-05 LAB — LACTIC ACID, PLASMA: Lactic Acid, Venous: 1.2 mmol/L (ref 0.5–2.2)

## 2012-03-05 MED ORDER — HYDROMORPHONE HCL PF 1 MG/ML IJ SOLN
1.0000 mg | INTRAMUSCULAR | Status: DC | PRN
  Administered 2012-03-06 – 2012-03-07 (×2): 1 mg via INTRAVENOUS
  Filled 2012-03-05 (×2): qty 1

## 2012-03-05 MED ORDER — PANTOPRAZOLE SODIUM 40 MG PO TBEC
40.0000 mg | DELAYED_RELEASE_TABLET | Freq: Every day | ORAL | Status: DC
  Administered 2012-03-05 – 2012-03-07 (×3): 40 mg via ORAL
  Filled 2012-03-05 (×3): qty 1

## 2012-03-05 MED ORDER — ONDANSETRON HCL 4 MG PO TABS: 4.0000 mg | ORAL_TABLET | Freq: Four times a day (QID) | ORAL | Status: DC | PRN

## 2012-03-05 MED ORDER — OXYCODONE HCL 5 MG PO TABS: 5.0000 mg | ORAL_TABLET | ORAL | Status: DC | PRN

## 2012-03-05 MED ORDER — POTASSIUM CHLORIDE IN NACL 20-0.9 MEQ/L-% IV SOLN
INTRAVENOUS | Status: AC
  Filled 2012-03-05: qty 1000

## 2012-03-05 MED ORDER — SENNA 8.6 MG PO TABS
1.0000 | ORAL_TABLET | Freq: Every day | ORAL | Status: DC
  Administered 2012-03-05 – 2012-03-08 (×4): 8.6 mg via ORAL
  Filled 2012-03-05 (×4): qty 1

## 2012-03-05 MED ORDER — DOCUSATE SODIUM 100 MG PO CAPS
100.0000 mg | ORAL_CAPSULE | Freq: Two times a day (BID) | ORAL | Status: DC
  Filled 2012-03-05 (×2): qty 1

## 2012-03-05 MED ORDER — ATORVASTATIN CALCIUM 20 MG PO TABS
20.0000 mg | ORAL_TABLET | Freq: Every day | ORAL | Status: DC
  Filled 2012-03-05: qty 1

## 2012-03-05 MED ORDER — POTASSIUM CHLORIDE IN NACL 20-0.9 MEQ/L-% IV SOLN
INTRAVENOUS | Status: DC
  Administered 2012-03-05 (×2): via INTRAVENOUS
  Filled 2012-03-05 (×3): qty 1000

## 2012-03-05 MED ORDER — HYDROMORPHONE HCL PF 1 MG/ML IJ SOLN: 0.5000 mg | INTRAMUSCULAR | Status: DC | PRN

## 2012-03-05 MED ORDER — TRAZODONE 25 MG HALF TABLET
25.0000 mg | ORAL_TABLET | Freq: Every evening | ORAL | Status: DC | PRN
  Administered 2012-03-07 (×2): 25 mg via ORAL
  Filled 2012-03-05 (×5): qty 1

## 2012-03-05 MED ORDER — VANCOMYCIN HCL IN DEXTROSE 1-5 GM/200ML-% IV SOLN
1000.0000 mg | Freq: Once | INTRAVENOUS | Status: AC
  Administered 2012-03-05: 1000 mg via INTRAVENOUS
  Filled 2012-03-05: qty 200

## 2012-03-05 MED ORDER — SODIUM CHLORIDE 0.9 % IV SOLN
INTRAVENOUS | Status: DC
  Administered 2012-03-05 – 2012-03-06 (×5): via INTRAVENOUS

## 2012-03-05 MED ORDER — LORAZEPAM 2 MG/ML IJ SOLN
1.0000 mg | INTRAMUSCULAR | Status: DC | PRN
  Administered 2012-03-05: 1 mg via INTRAVENOUS
  Filled 2012-03-05: qty 1

## 2012-03-05 MED ORDER — ONDANSETRON HCL 4 MG/2ML IJ SOLN: 4.0000 mg | Freq: Four times a day (QID) | INTRAMUSCULAR | Status: DC | PRN

## 2012-03-05 MED ORDER — BISACODYL 10 MG RE SUPP: 10.0000 mg | Freq: Every day | RECTAL | Status: DC | PRN

## 2012-03-05 MED ORDER — VENLAFAXINE HCL ER 75 MG PO CP24
75.0000 mg | ORAL_CAPSULE | Freq: Every day | ORAL | Status: DC
  Administered 2012-03-05 – 2012-03-08 (×4): 75 mg via ORAL
  Filled 2012-03-05 (×4): qty 1

## 2012-03-05 MED ORDER — CLINDAMYCIN PHOSPHATE 900 MG/50ML IV SOLN
900.0000 mg | Freq: Once | INTRAVENOUS | Status: AC
  Administered 2012-03-05: 900 mg via INTRAVENOUS
  Filled 2012-03-05: qty 50

## 2012-03-05 MED ORDER — FLEET ENEMA 7-19 GM/118ML RE ENEM
1.0000 | ENEMA | Freq: Once | RECTAL | Status: AC | PRN
  Filled 2012-03-05: qty 1

## 2012-03-05 MED ORDER — PIPERACILLIN-TAZOBACTAM 3.375 G IVPB
3.3750 g | Freq: Once | INTRAVENOUS | Status: AC
  Administered 2012-03-05: 3.375 g via INTRAVENOUS
  Filled 2012-03-05: qty 50

## 2012-03-05 MED ORDER — OXYCODONE HCL 5 MG PO TABS
5.0000 mg | ORAL_TABLET | ORAL | Status: DC | PRN
  Administered 2012-03-05 – 2012-03-08 (×10): 10 mg via ORAL
  Filled 2012-03-05 (×10): qty 2

## 2012-03-05 MED ORDER — ENOXAPARIN SODIUM 40 MG/0.4ML ~~LOC~~ SOLN
40.0000 mg | Freq: Every day | SUBCUTANEOUS | Status: DC
  Administered 2012-03-05 – 2012-03-06 (×2): 40 mg via SUBCUTANEOUS
  Filled 2012-03-05 (×2): qty 0.4

## 2012-03-05 MED ORDER — SODIUM CHLORIDE 0.9 % IJ SOLN: 3.0000 mL | Freq: Two times a day (BID) | INTRAMUSCULAR | Status: DC

## 2012-03-05 NOTE — Progress Notes (Signed)
Pt. Arrived by [facility] at 0430 in bilateral wrist restraints applied while in [facility] ED.  We removed the restraints bc the pt was less agitated and was not pulling on lines.  I d/c'd the order.  Pt is restless in the bed and wanted to stand up to urinate.  I explained he had a foley catheter and needed to remain in bed until the MD specifies otherwise.  Bed alarm is on.

## 2012-03-05 NOTE — ED Notes (Signed)
Spoke with [employer] it is reported when pt was checked on last am, pt alert and talking with officer, tonight pt was found very confused and appeared to be "hallucinating", it is also reported neighbors advise pt has had a decrease in loc since being d/c from [facility] 2 days ago
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
## 2012-03-05 NOTE — Progress Notes (Signed)
Utilization Review Completed.Robert Davidson T4/19/2013   

## 2012-03-05 NOTE — ED Notes (Signed)
Patient lying in bed restless, moving legs constantly. Soft restraints remain in place bilateral upper extremities. Radial pulses strong and intact. Sitter at bedside. Patient responds to verbal stimuli but remains disoriented to time, place, and situation.
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
## 2012-03-06 LAB — COMPREHENSIVE METABOLIC PANEL
ALT: 136 U/L — ABNORMAL HIGH (ref 0–53)
Alkaline Phosphatase: 46 U/L (ref 39–117)
BUN: 34 mg/dL — ABNORMAL HIGH (ref 6–23)
CO2: 24 mEq/L (ref 19–32)
GFR calc Af Amer: 62 mL/min — ABNORMAL LOW (ref >90)
GFR calc non Af Amer: 53 mL/min — ABNORMAL LOW (ref >90)
Glucose, Bld: 93 mg/dL (ref 70–99)
Potassium: 3.4 mEq/L — ABNORMAL LOW (ref 3.5–5.1)
Sodium: 137 mEq/L (ref 135–145)

## 2012-03-06 LAB — URINE CULTURE: Culture  Setup Time: 201304191736

## 2012-03-06 LAB — CK TOTAL AND CKMB (NOT AT ARMC)
CK, MB: 16.1 ng/mL (ref 0.3–4.0)
Total CK: 3971 U/L — ABNORMAL HIGH (ref 7–232)

## 2012-03-06 LAB — CBC
MCHC: 34.3 g/dL (ref 30.0–36.0)
Platelets: 192 10*3/uL (ref 150–400)
RDW: 13.2 % (ref 11.5–15.5)

## 2012-03-06 MED ORDER — LORAZEPAM 2 MG/ML IJ SOLN: 0.5000 mg | INTRAMUSCULAR | Status: DC | PRN

## 2012-03-06 MED ORDER — POTASSIUM CHLORIDE CRYS ER 20 MEQ PO TBCR
40.0000 meq | EXTENDED_RELEASE_TABLET | Freq: Two times a day (BID) | ORAL | Status: AC
  Administered 2012-03-06 – 2012-03-07 (×3): 40 meq via ORAL
  Filled 2012-03-06 (×3): qty 2

## 2012-03-06 MED ORDER — SODIUM CHLORIDE 0.9 % IV SOLN
INTRAVENOUS | Status: DC
Start: 2012-03-06 — End: 2012-03-08
  Administered 2012-03-06: 125 mL via INTRAVENOUS
  Administered 2012-03-08: 10:00:00 via INTRAVENOUS
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
## 2012-03-07 LAB — COMPREHENSIVE METABOLIC PANEL
Alkaline Phosphatase: 47 U/L (ref 39–117)
BUN: 22 mg/dL (ref 6–23)
Calcium: 7.9 mg/dL — ABNORMAL LOW (ref 8.4–10.5)
Creatinine, Ser: 1.24 mg/dL (ref 0.50–1.35)
GFR calc Af Amer: 72 mL/min — ABNORMAL LOW (ref >90)
Glucose, Bld: 80 mg/dL (ref 70–99)
Total Protein: 5.3 g/dL — ABNORMAL LOW (ref 6.0–8.3)

## 2012-03-07 LAB — CBC
HCT: 36.5 % — ABNORMAL LOW (ref 39.0–52.0)
Hemoglobin: 12.5 g/dL — ABNORMAL LOW (ref 13.0–17.0)
RDW: 13.1 % (ref 11.5–15.5)
WBC: 7.9 10*3/uL (ref 4.0–10.5)
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
## 2012-03-08 ENCOUNTER — Encounter (HOSPITAL_COMMUNITY): Payer: Self-pay | Admitting: Emergency Medicine

## 2012-03-08 LAB — HEPATITIS PANEL, ACUTE
HCV Ab: NEGATIVE
Hep A IgM: NEGATIVE
Hep B C IgM: NEGATIVE
Hepatitis B Surface Ag: NEGATIVE

## 2012-03-08 LAB — CK TOTAL AND CKMB (NOT AT ARMC)
CK, MB: 6.1 ng/mL (ref 0.3–4.0)
Relative Index: 0.9 (ref 0.0–2.5)
Total CK: 716 U/L — ABNORMAL HIGH (ref 7–232)

## 2012-03-08 LAB — BASIC METABOLIC PANEL
GFR calc non Af Amer: 58 mL/min — ABNORMAL LOW (ref >90)
Glucose, Bld: 79 mg/dL (ref 70–99)
Potassium: 3.8 mEq/L (ref 3.5–5.1)
Sodium: 137 mEq/L (ref 135–145)

## 2012-03-08 LAB — CBC
Hemoglobin: 13.2 g/dL (ref 13.0–17.0)
MCH: 27.8 pg (ref 26.0–34.0)
RBC: 4.75 MIL/uL (ref 4.22–5.81)

## 2012-03-08 NOTE — Discharge Instructions (Signed)
Increase your intake of water over the next 3 days, drinking at least 1L of extra fluid daily.    We are asking you to hold your BP medication for now as your kidneys need time to recover.  You will need to see your primary medical doctor to determine when it is appropriate to restart you BP medication.  Rhabdomyolysis Rhabdomyolysis is the breakdown of muscle fibers due to injury. The injury may come from physical damage to the muscle like an injury but other causes are:  High fever (hyperthermia).   Seizures (convulsions).   Low phosphate levels.   Diseases of metabolism.   Heatstroke.   Drug toxicity.   Over exertion.   Alcoholism.   Muscle is cut off from oxygen (anoxia).   The squeezing of nerves and blood vessels (compartment syndrome).  Some drugs which may cause the breakdown of muscle are:  Antibiotics.   Statins.   Alcohol.   Animal toxins.  Myoglobin is a substance which helps muscle use oxygen. When the muscle is damaged, the myoglobin is released into the bloodstream. It is filtered out of the bloodstream by the kidneys. Myoglobin may block up the kidneys. This may cause damage, such as kidney failure. It also breaks down into other damaging toxic parts, which also cause kidney failure.  SYMPTOMS   Dark, red, or tea colored urine.   Weakness of affected muscles.   Weight gain from water retention.   Joint aches and pains.   Irregular heart from high potassium in the blood.   Muscle tenderness or aching.   Generalized weakness.   Seizures.   Feeling tired (fatigue).  DIAGNOSIS  Your caregiver may find muscle tenderness on exam and suspect the problem. Urine tests and blood work can confirm the problem. TREATMENT   Early and aggressive treatment with large amounts of fluids may help prevent kidney failure.   Water producing medicine (diuretic) may be used to help flush the kidneys.   High potassium and calcium problems (electrolyte) in your  blood may need treatment.  HOME CARE INSTRUCTIONS  This problem is usually cared for in a hospital. If you are allowed to go home and require dialysis, make sure you keep all appointments for lab work and dialysis. Not doing so could result in death. Document Released: 10/17/2004 Document Revised: 10/23/2011 Document Reviewed: 04/30/2009 [employer] Patient Information 2012 [place], [place].
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
## 2012-03-09 LAB — CULTURE, BLOOD (ROUTINE X 2): Culture: NO GROWTH
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
## 2012-03-16 DIAGNOSIS — M6282 Rhabdomyolysis: Secondary | ICD-10-CM | POA: Diagnosis not present

## 2012-03-16 DIAGNOSIS — F411 Generalized anxiety disorder: Secondary | ICD-10-CM | POA: Diagnosis not present

## 2012-03-16 DIAGNOSIS — I10 Essential (primary) hypertension: Secondary | ICD-10-CM | POA: Diagnosis not present

## 2012-03-16 DIAGNOSIS — M899 Disorder of bone, unspecified: Secondary | ICD-10-CM | POA: Diagnosis not present

## 2012-03-16 DIAGNOSIS — E782 Mixed hyperlipidemia: Secondary | ICD-10-CM | POA: Diagnosis not present

## 2012-03-16 DIAGNOSIS — M545 Low back pain: Secondary | ICD-10-CM | POA: Diagnosis not present

## 2012-03-16 DIAGNOSIS — M949 Disorder of cartilage, unspecified: Secondary | ICD-10-CM | POA: Diagnosis not present

## 2012-03-16 DIAGNOSIS — E291 Testicular hypofunction: Secondary | ICD-10-CM | POA: Diagnosis not present

## 2012-03-17 DIAGNOSIS — G541 Lumbosacral plexus disorders: Secondary | ICD-10-CM | POA: Diagnosis not present

## 2012-03-17 DIAGNOSIS — F341 Dysthymic disorder: Secondary | ICD-10-CM | POA: Diagnosis not present

## 2012-03-17 DIAGNOSIS — Z79899 Other long term (current) drug therapy: Secondary | ICD-10-CM | POA: Diagnosis not present

## 2012-03-17 DIAGNOSIS — M546 Pain in thoracic spine: Secondary | ICD-10-CM | POA: Diagnosis not present

## 2012-04-14 DIAGNOSIS — M542 Cervicalgia: Secondary | ICD-10-CM | POA: Diagnosis not present

## 2012-04-14 DIAGNOSIS — M546 Pain in thoracic spine: Secondary | ICD-10-CM | POA: Diagnosis not present

## 2012-04-14 DIAGNOSIS — Z79899 Other long term (current) drug therapy: Secondary | ICD-10-CM | POA: Diagnosis not present

## 2012-04-14 DIAGNOSIS — F341 Dysthymic disorder: Secondary | ICD-10-CM | POA: Diagnosis not present

## 2012-04-14 DIAGNOSIS — G541 Lumbosacral plexus disorders: Secondary | ICD-10-CM | POA: Diagnosis not present

## 2012-05-05 ENCOUNTER — Ambulatory Visit: Payer: Medicare Other | Admitting: Infectious Disease

## 2012-05-05 ENCOUNTER — Telehealth: Payer: Self-pay | Admitting: *Deleted

## 2012-05-05 NOTE — Telephone Encounter (Signed)
Called and left patient a voice mail to call the clinic to reschedule his appt.  He no showed today. Robert Davidson

## 2012-05-12 DIAGNOSIS — Z79899 Other long term (current) drug therapy: Secondary | ICD-10-CM | POA: Diagnosis not present

## 2012-05-12 DIAGNOSIS — F341 Dysthymic disorder: Secondary | ICD-10-CM | POA: Diagnosis not present

## 2012-05-12 DIAGNOSIS — M546 Pain in thoracic spine: Secondary | ICD-10-CM | POA: Diagnosis not present

## 2012-05-12 DIAGNOSIS — G541 Lumbosacral plexus disorders: Secondary | ICD-10-CM | POA: Diagnosis not present

## 2012-06-09 DIAGNOSIS — Z79899 Other long term (current) drug therapy: Secondary | ICD-10-CM | POA: Diagnosis not present

## 2012-06-09 DIAGNOSIS — G541 Lumbosacral plexus disorders: Secondary | ICD-10-CM | POA: Diagnosis not present

## 2012-06-09 DIAGNOSIS — F341 Dysthymic disorder: Secondary | ICD-10-CM | POA: Diagnosis not present

## 2012-06-09 DIAGNOSIS — M546 Pain in thoracic spine: Secondary | ICD-10-CM | POA: Diagnosis not present

## 2012-06-29 DIAGNOSIS — I10 Essential (primary) hypertension: Secondary | ICD-10-CM | POA: Diagnosis not present

## 2012-06-29 DIAGNOSIS — N498 Inflammatory disorders of other specified male genital organs: Secondary | ICD-10-CM | POA: Diagnosis not present

## 2012-06-29 DIAGNOSIS — E291 Testicular hypofunction: Secondary | ICD-10-CM | POA: Diagnosis not present

## 2012-06-29 DIAGNOSIS — E782 Mixed hyperlipidemia: Secondary | ICD-10-CM | POA: Diagnosis not present

## 2012-06-30 ENCOUNTER — Other Ambulatory Visit (HOSPITAL_COMMUNITY): Payer: Self-pay | Admitting: Family Medicine

## 2012-06-30 DIAGNOSIS — N492 Inflammatory disorders of scrotum: Secondary | ICD-10-CM

## 2012-07-01 ENCOUNTER — Ambulatory Visit (HOSPITAL_COMMUNITY)
Admission: RE | Admit: 2012-07-01 | Discharge: 2012-07-01 | Disposition: A | Discharge status: Home / Self Care | Payer: Medicare Other | Source: Ambulatory Visit | Attending: Family Medicine | Admitting: Family Medicine

## 2012-07-01 ENCOUNTER — Other Ambulatory Visit (HOSPITAL_COMMUNITY): Payer: Self-pay | Admitting: Family Medicine

## 2012-07-01 DIAGNOSIS — N432 Other hydrocele: Secondary | ICD-10-CM | POA: Diagnosis not present

## 2012-07-01 DIAGNOSIS — N508 Other specified disorders of male genital organs: Secondary | ICD-10-CM | POA: Diagnosis not present

## 2012-07-01 DIAGNOSIS — N492 Inflammatory disorders of scrotum: Secondary | ICD-10-CM

## 2012-07-01 DIAGNOSIS — N433 Hydrocele, unspecified: Secondary | ICD-10-CM | POA: Diagnosis not present

## 2012-07-08 DIAGNOSIS — M542 Cervicalgia: Secondary | ICD-10-CM | POA: Diagnosis not present

## 2012-07-08 DIAGNOSIS — M546 Pain in thoracic spine: Secondary | ICD-10-CM | POA: Diagnosis not present

## 2012-07-08 DIAGNOSIS — G541 Lumbosacral plexus disorders: Secondary | ICD-10-CM | POA: Diagnosis not present

## 2012-07-08 DIAGNOSIS — Z79899 Other long term (current) drug therapy: Secondary | ICD-10-CM | POA: Diagnosis not present

## 2012-07-08 DIAGNOSIS — F341 Dysthymic disorder: Secondary | ICD-10-CM | POA: Diagnosis not present

## 2012-07-08 DIAGNOSIS — H81399 Other peripheral vertigo, unspecified ear: Secondary | ICD-10-CM | POA: Diagnosis not present

## 2012-08-09 DIAGNOSIS — M546 Pain in thoracic spine: Secondary | ICD-10-CM | POA: Diagnosis not present

## 2012-08-09 DIAGNOSIS — G541 Lumbosacral plexus disorders: Secondary | ICD-10-CM | POA: Diagnosis not present

## 2012-08-09 DIAGNOSIS — H81399 Other peripheral vertigo, unspecified ear: Secondary | ICD-10-CM | POA: Diagnosis not present

## 2012-08-09 DIAGNOSIS — F341 Dysthymic disorder: Secondary | ICD-10-CM | POA: Diagnosis not present

## 2012-08-27 DIAGNOSIS — Z23 Encounter for immunization: Secondary | ICD-10-CM | POA: Diagnosis not present

## 2012-09-07 DIAGNOSIS — G541 Lumbosacral plexus disorders: Secondary | ICD-10-CM | POA: Diagnosis not present

## 2012-09-07 DIAGNOSIS — M546 Pain in thoracic spine: Secondary | ICD-10-CM | POA: Diagnosis not present

## 2012-09-07 DIAGNOSIS — H81399 Other peripheral vertigo, unspecified ear: Secondary | ICD-10-CM | POA: Diagnosis not present

## 2012-09-07 DIAGNOSIS — F341 Dysthymic disorder: Secondary | ICD-10-CM | POA: Diagnosis not present

## 2012-10-05 DIAGNOSIS — H524 Presbyopia: Secondary | ICD-10-CM | POA: Diagnosis not present

## 2012-10-07 DIAGNOSIS — M546 Pain in thoracic spine: Secondary | ICD-10-CM | POA: Diagnosis not present

## 2012-10-07 DIAGNOSIS — F341 Dysthymic disorder: Secondary | ICD-10-CM | POA: Diagnosis not present

## 2012-10-07 DIAGNOSIS — H81399 Other peripheral vertigo, unspecified ear: Secondary | ICD-10-CM | POA: Diagnosis not present

## 2012-10-07 DIAGNOSIS — Z79899 Other long term (current) drug therapy: Secondary | ICD-10-CM | POA: Diagnosis not present

## 2012-10-07 DIAGNOSIS — G541 Lumbosacral plexus disorders: Secondary | ICD-10-CM | POA: Diagnosis not present

## 2012-10-07 DIAGNOSIS — M542 Cervicalgia: Secondary | ICD-10-CM | POA: Diagnosis not present

## 2012-11-04 DIAGNOSIS — M546 Pain in thoracic spine: Secondary | ICD-10-CM | POA: Diagnosis not present

## 2012-11-04 DIAGNOSIS — H81399 Other peripheral vertigo, unspecified ear: Secondary | ICD-10-CM | POA: Diagnosis not present

## 2012-11-04 DIAGNOSIS — F341 Dysthymic disorder: Secondary | ICD-10-CM | POA: Diagnosis not present

## 2012-11-04 DIAGNOSIS — G541 Lumbosacral plexus disorders: Secondary | ICD-10-CM | POA: Diagnosis not present

## 2012-12-03 DIAGNOSIS — H81399 Other peripheral vertigo, unspecified ear: Secondary | ICD-10-CM | POA: Diagnosis not present

## 2012-12-03 DIAGNOSIS — F341 Dysthymic disorder: Secondary | ICD-10-CM | POA: Diagnosis not present

## 2012-12-03 DIAGNOSIS — M546 Pain in thoracic spine: Secondary | ICD-10-CM | POA: Diagnosis not present

## 2012-12-03 DIAGNOSIS — G541 Lumbosacral plexus disorders: Secondary | ICD-10-CM | POA: Diagnosis not present

## 2013-01-14 DIAGNOSIS — F341 Dysthymic disorder: Secondary | ICD-10-CM | POA: Diagnosis not present

## 2013-01-14 DIAGNOSIS — G541 Lumbosacral plexus disorders: Secondary | ICD-10-CM | POA: Diagnosis not present

## 2013-01-14 DIAGNOSIS — Z79899 Other long term (current) drug therapy: Secondary | ICD-10-CM | POA: Diagnosis not present

## 2013-01-14 DIAGNOSIS — M546 Pain in thoracic spine: Secondary | ICD-10-CM | POA: Diagnosis not present

## 2013-01-14 DIAGNOSIS — H81399 Other peripheral vertigo, unspecified ear: Secondary | ICD-10-CM | POA: Diagnosis not present

## 2013-01-14 DIAGNOSIS — M542 Cervicalgia: Secondary | ICD-10-CM | POA: Diagnosis not present

## 2013-02-11 DIAGNOSIS — F341 Dysthymic disorder: Secondary | ICD-10-CM | POA: Diagnosis not present

## 2013-02-11 DIAGNOSIS — E782 Mixed hyperlipidemia: Secondary | ICD-10-CM | POA: Diagnosis not present

## 2013-02-11 DIAGNOSIS — M949 Disorder of cartilage, unspecified: Secondary | ICD-10-CM | POA: Diagnosis not present

## 2013-02-11 DIAGNOSIS — I10 Essential (primary) hypertension: Secondary | ICD-10-CM | POA: Diagnosis not present

## 2013-02-11 DIAGNOSIS — M546 Pain in thoracic spine: Secondary | ICD-10-CM | POA: Diagnosis not present

## 2013-02-11 DIAGNOSIS — M899 Disorder of bone, unspecified: Secondary | ICD-10-CM | POA: Diagnosis not present

## 2013-02-11 DIAGNOSIS — M545 Low back pain: Secondary | ICD-10-CM | POA: Diagnosis not present

## 2013-02-11 DIAGNOSIS — H81399 Other peripheral vertigo, unspecified ear: Secondary | ICD-10-CM | POA: Diagnosis not present

## 2013-02-11 DIAGNOSIS — N498 Inflammatory disorders of other specified male genital organs: Secondary | ICD-10-CM | POA: Diagnosis not present

## 2013-02-11 DIAGNOSIS — F411 Generalized anxiety disorder: Secondary | ICD-10-CM | POA: Diagnosis not present

## 2013-02-11 DIAGNOSIS — E291 Testicular hypofunction: Secondary | ICD-10-CM | POA: Diagnosis not present

## 2013-02-11 DIAGNOSIS — G541 Lumbosacral plexus disorders: Secondary | ICD-10-CM | POA: Diagnosis not present

## 2013-02-21 DIAGNOSIS — N289 Disorder of kidney and ureter, unspecified: Secondary | ICD-10-CM | POA: Diagnosis not present

## 2013-03-09 DIAGNOSIS — M899 Disorder of bone, unspecified: Secondary | ICD-10-CM | POA: Diagnosis not present

## 2013-03-09 DIAGNOSIS — M949 Disorder of cartilage, unspecified: Secondary | ICD-10-CM | POA: Diagnosis not present

## 2013-03-11 DIAGNOSIS — H81399 Other peripheral vertigo, unspecified ear: Secondary | ICD-10-CM | POA: Diagnosis not present

## 2013-03-11 DIAGNOSIS — G541 Lumbosacral plexus disorders: Secondary | ICD-10-CM | POA: Diagnosis not present

## 2013-03-11 DIAGNOSIS — H8190 Unspecified disorder of vestibular function, unspecified ear: Secondary | ICD-10-CM | POA: Diagnosis not present

## 2013-03-11 DIAGNOSIS — M546 Pain in thoracic spine: Secondary | ICD-10-CM | POA: Diagnosis not present

## 2013-04-05 ENCOUNTER — Ambulatory Visit: Payer: Medicare Other | Admitting: Urology

## 2013-04-08 DIAGNOSIS — F341 Dysthymic disorder: Secondary | ICD-10-CM | POA: Diagnosis not present

## 2013-04-08 DIAGNOSIS — H81399 Other peripheral vertigo, unspecified ear: Secondary | ICD-10-CM | POA: Diagnosis not present

## 2013-04-08 DIAGNOSIS — G541 Lumbosacral plexus disorders: Secondary | ICD-10-CM | POA: Diagnosis not present

## 2013-04-08 DIAGNOSIS — H8190 Unspecified disorder of vestibular function, unspecified ear: Secondary | ICD-10-CM | POA: Diagnosis not present

## 2013-05-04 DIAGNOSIS — M542 Cervicalgia: Secondary | ICD-10-CM | POA: Diagnosis not present

## 2013-05-05 DIAGNOSIS — M545 Low back pain: Secondary | ICD-10-CM | POA: Diagnosis not present

## 2013-05-05 DIAGNOSIS — Z79899 Other long term (current) drug therapy: Secondary | ICD-10-CM | POA: Diagnosis not present

## 2013-05-05 DIAGNOSIS — M542 Cervicalgia: Secondary | ICD-10-CM | POA: Diagnosis not present

## 2013-05-05 DIAGNOSIS — M546 Pain in thoracic spine: Secondary | ICD-10-CM | POA: Diagnosis not present

## 2013-05-06 DIAGNOSIS — Z79899 Other long term (current) drug therapy: Secondary | ICD-10-CM | POA: Diagnosis not present

## 2013-05-06 DIAGNOSIS — M542 Cervicalgia: Secondary | ICD-10-CM | POA: Diagnosis not present

## 2013-05-06 DIAGNOSIS — M545 Low back pain: Secondary | ICD-10-CM | POA: Diagnosis not present

## 2013-05-06 DIAGNOSIS — M546 Pain in thoracic spine: Secondary | ICD-10-CM | POA: Diagnosis not present

## 2013-06-02 DIAGNOSIS — G541 Lumbosacral plexus disorders: Secondary | ICD-10-CM | POA: Diagnosis not present

## 2013-06-02 DIAGNOSIS — F341 Dysthymic disorder: Secondary | ICD-10-CM | POA: Diagnosis not present

## 2013-06-02 DIAGNOSIS — H8190 Unspecified disorder of vestibular function, unspecified ear: Secondary | ICD-10-CM | POA: Diagnosis not present

## 2013-06-02 DIAGNOSIS — H81399 Other peripheral vertigo, unspecified ear: Secondary | ICD-10-CM | POA: Diagnosis not present

## 2013-07-04 DIAGNOSIS — M545 Low back pain: Secondary | ICD-10-CM | POA: Diagnosis not present

## 2013-07-04 DIAGNOSIS — M542 Cervicalgia: Secondary | ICD-10-CM | POA: Diagnosis not present

## 2013-07-05 DIAGNOSIS — H8190 Unspecified disorder of vestibular function, unspecified ear: Secondary | ICD-10-CM | POA: Diagnosis not present

## 2013-07-05 DIAGNOSIS — H81399 Other peripheral vertigo, unspecified ear: Secondary | ICD-10-CM | POA: Diagnosis not present

## 2013-07-05 DIAGNOSIS — G541 Lumbosacral plexus disorders: Secondary | ICD-10-CM | POA: Diagnosis not present

## 2013-07-05 DIAGNOSIS — M5430 Sciatica, unspecified side: Secondary | ICD-10-CM | POA: Diagnosis not present

## 2013-07-05 DIAGNOSIS — M545 Low back pain: Secondary | ICD-10-CM | POA: Diagnosis not present

## 2013-07-05 DIAGNOSIS — M542 Cervicalgia: Secondary | ICD-10-CM | POA: Diagnosis not present

## 2013-08-08 ENCOUNTER — Encounter (HOSPITAL_COMMUNITY): Payer: Self-pay | Admitting: *Deleted

## 2013-08-08 ENCOUNTER — Inpatient Hospital Stay (HOSPITAL_COMMUNITY)
Admission: EM | Admit: 2013-08-08 | Discharge: 2013-08-10 | DRG: 683 | Disposition: A | Discharge status: Home / Self Care | Payer: Medicare Other | Attending: Internal Medicine | Admitting: Internal Medicine

## 2013-08-08 DIAGNOSIS — E871 Hypo-osmolality and hyponatremia: Secondary | ICD-10-CM | POA: Diagnosis present

## 2013-08-08 DIAGNOSIS — Z23 Encounter for immunization: Secondary | ICD-10-CM | POA: Diagnosis not present

## 2013-08-08 DIAGNOSIS — F3289 Other specified depressive episodes: Secondary | ICD-10-CM | POA: Diagnosis present

## 2013-08-08 DIAGNOSIS — N179 Acute kidney failure, unspecified: Principal | ICD-10-CM | POA: Diagnosis present

## 2013-08-08 DIAGNOSIS — R42 Dizziness and giddiness: Secondary | ICD-10-CM | POA: Diagnosis not present

## 2013-08-08 DIAGNOSIS — I959 Hypotension, unspecified: Secondary | ICD-10-CM | POA: Diagnosis present

## 2013-08-08 DIAGNOSIS — M549 Dorsalgia, unspecified: Secondary | ICD-10-CM | POA: Diagnosis present

## 2013-08-08 DIAGNOSIS — I10 Essential (primary) hypertension: Secondary | ICD-10-CM | POA: Diagnosis present

## 2013-08-08 DIAGNOSIS — F329 Major depressive disorder, single episode, unspecified: Secondary | ICD-10-CM | POA: Diagnosis not present

## 2013-08-08 DIAGNOSIS — E861 Hypovolemia: Secondary | ICD-10-CM | POA: Diagnosis present

## 2013-08-08 DIAGNOSIS — G8929 Other chronic pain: Secondary | ICD-10-CM | POA: Diagnosis present

## 2013-08-08 DIAGNOSIS — T502X5A Adverse effect of carbonic-anhydrase inhibitors, benzothiadiazides and other diuretics, initial encounter: Secondary | ICD-10-CM | POA: Diagnosis present

## 2013-08-08 DIAGNOSIS — R3989 Other symptoms and signs involving the genitourinary system: Secondary | ICD-10-CM | POA: Diagnosis present

## 2013-08-08 DIAGNOSIS — R55 Syncope and collapse: Secondary | ICD-10-CM

## 2013-08-08 DIAGNOSIS — E860 Dehydration: Secondary | ICD-10-CM

## 2013-08-08 DIAGNOSIS — F32A Depression, unspecified: Secondary | ICD-10-CM

## 2013-08-08 DIAGNOSIS — M810 Age-related osteoporosis without current pathological fracture: Secondary | ICD-10-CM | POA: Diagnosis present

## 2013-08-08 DIAGNOSIS — E291 Testicular hypofunction: Secondary | ICD-10-CM

## 2013-08-08 DIAGNOSIS — S37009A Unspecified injury of unspecified kidney, initial encounter: Secondary | ICD-10-CM | POA: Diagnosis not present

## 2013-08-08 LAB — CBC WITH DIFFERENTIAL/PLATELET
Basophils Absolute: 0 10*3/uL (ref 0.0–0.1)
Basophils Relative: 0 % (ref 0–1)
HCT: 37.2 % — ABNORMAL LOW (ref 39.0–52.0)
Lymphocytes Relative: 21 % (ref 12–46)
MCHC: 34.9 g/dL (ref 30.0–36.0)
Monocytes Absolute: 0.8 10*3/uL (ref 0.1–1.0)
Neutro Abs: 5.4 10*3/uL (ref 1.7–7.7)
Platelets: 321 10*3/uL (ref 150–400)
RDW: 12 % (ref 11.5–15.5)
WBC: 7.9 10*3/uL (ref 4.0–10.5)

## 2013-08-08 LAB — COMPREHENSIVE METABOLIC PANEL
ALT: 21 U/L (ref 0–53)
AST: 24 U/L (ref 0–37)
Albumin: 4 g/dL (ref 3.5–5.2)
Calcium: 10.5 mg/dL (ref 8.4–10.5)
Chloride: 82 mEq/L — ABNORMAL LOW (ref 96–112)
Creatinine, Ser: 2.88 mg/dL — ABNORMAL HIGH (ref 0.50–1.35)
GFR calc Af Amer: 26 mL/min — ABNORMAL LOW (ref >90)
Glucose, Bld: 111 mg/dL — ABNORMAL HIGH (ref 70–99)
Potassium: 3.9 mEq/L (ref 3.5–5.1)
Sodium: 124 mEq/L — ABNORMAL LOW (ref 135–145)
Total Bilirubin: 0.2 mg/dL — ABNORMAL LOW (ref 0.3–1.2)

## 2013-08-08 LAB — CK: Total CK: 59 U/L (ref 7–232)

## 2013-08-08 LAB — URINALYSIS, ROUTINE W REFLEX MICROSCOPIC
Glucose, UA: NEGATIVE mg/dL
Hgb urine dipstick: NEGATIVE
Leukocytes, UA: NEGATIVE
Specific Gravity, Urine: <1.005 — ABNORMAL LOW (ref 1.005–1.030)

## 2013-08-08 MED ORDER — VENLAFAXINE HCL ER 75 MG PO CP24
75.0000 mg | ORAL_CAPSULE | Freq: Every day | ORAL | Status: DC
  Administered 2013-08-09 – 2013-08-10 (×2): 75 mg via ORAL
  Filled 2013-08-08 (×4): qty 1

## 2013-08-08 MED ORDER — SODIUM CHLORIDE 0.9 % IV SOLN
1000.0000 mL | Freq: Once | INTRAVENOUS | Status: AC
  Administered 2013-08-08: 1000 mL via INTRAVENOUS

## 2013-08-08 MED ORDER — SODIUM CHLORIDE 0.9 % IV SOLN
1000.0000 mL | INTRAVENOUS | Status: DC
  Administered 2013-08-08 – 2013-08-09 (×2): 1000 mL via INTRAVENOUS

## 2013-08-08 MED ORDER — OXYCODONE HCL 5 MG PO TABS
30.0000 mg | ORAL_TABLET | ORAL | Status: DC | PRN
  Administered 2013-08-08 – 2013-08-10 (×8): 30 mg via ORAL
  Filled 2013-08-08 (×8): qty 6

## 2013-08-08 MED ORDER — SODIUM CHLORIDE 0.9 % IV SOLN: INTRAVENOUS | Status: DC

## 2013-08-08 MED ORDER — HEPARIN SODIUM (PORCINE) 5000 UNIT/ML IJ SOLN
5000.0000 [IU] | Freq: Three times a day (TID) | INTRAMUSCULAR | Status: DC
  Administered 2013-08-08 – 2013-08-10 (×4): 5000 [IU] via SUBCUTANEOUS
  Filled 2013-08-08 (×4): qty 1

## 2013-08-08 MED ORDER — COSYNTROPIN 0.25 MG IJ SOLR
0.2500 mg | Freq: Once | INTRAMUSCULAR | Status: AC
  Administered 2013-08-09: 0.25 mg via INTRAVENOUS
  Filled 2013-08-08: qty 0.25

## 2013-08-08 MED ORDER — DIAZEPAM 5 MG PO TABS: 5.0000 mg | ORAL_TABLET | Freq: Four times a day (QID) | ORAL | Status: DC | PRN

## 2013-08-08 NOTE — ED Notes (Addendum)
Dizzy for 3 weeks Seen by ent and told to come here.  Syncope last week x8 , 1 episode today.  No n/v.  Pt says he is orthostatic.  Takes the same bp med he has taken for 10 years.
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
## 2013-08-08 NOTE — ED Provider Notes (Signed)
CSN: [id number]     Arrival date & time 08/08/13  1735 History   First MD Initiated Contact with Patient 08/08/13 1841     Chief Complaint  Patient presents with  . Dizziness   (Consider location/radiation/quality/duration/timing/severity/associated sxs/prior Treatment) HPI Comments: Robert Davidson is a 62 y.o. Male who presents for evaluation of low blood pressure. He went to see an ENT doctor today, or dizziness, and was advised to come to the emergency department to be evaluated for hypotension. The doctor did not think he had an otologic or central process causing dizziness. The patient has had several falls over 2 weeks' time and a persistent sensation of "dizziness". He denies fever, chills, nausea, vomiting, shortness of breath, chest pain, or changes in his voiding habits. He has chronic low back pain. There are no other known modifying factors.  The history is provided by the patient.    Past Medical History  Diagnosis Date  . HTN (hypertension)   . Hypogonadism male   . HTN (hypertension)   . Osteoporosis    Past Surgical History  Procedure Laterality Date  . Herniated disc t5     History reviewed. No pertinent family history. History  Substance Use Topics  . Smoking status: Never Smoker   . Smokeless tobacco: Never Used  . Alcohol Use: No    Review of Systems  All other systems reviewed and are negative.    Allergies  Rifampin and Sulfa drugs cross reactors  Home Medications   Current Outpatient Rx  Name  Route  Sig  Dispense  Refill  . atorvastatin (LIPITOR) 10 MG tablet   Oral   Take 10 mg by mouth daily.          . Calcium Carbonate-Vitamin D (CALCIUM 600 + D PO)   Oral   Take 1 tablet by mouth 2 (two) times daily.         . cyclobenzaprine (FLEXERIL) 10 MG tablet   Oral   Take 10 mg by mouth 3 (three) times daily as needed. For muscle spasms         . diazepam (VALIUM) 5 MG tablet   Oral   Take 5 mg by mouth every 6 (six) hours as needed.  For anxiety         . lisinopril-hydrochlorothiazide (PRINZIDE,ZESTORETIC) 20-25 MG per tablet   Oral   Take 1 tablet by mouth every morning.          Marland Kitchen oxycodone (ROXICODONE) 30 MG immediate release tablet   Oral   Take 30 mg by mouth every 4 (four) hours as needed. For pain         . venlafaxine XR (EFFEXOR-XR) 75 MG 24 hr capsule   Oral   Take 75 mg by mouth daily.          BP 123/77  Pulse 81  Temp(Src) 97.9 F (36.6 C) (Oral)  Resp 18  Ht 5\' 9"  (1.753 m)  Wt 186 lb 7 oz (84.567 kg)  BMI 27.52 kg/m2  SpO2 100% Physical Exam  Nursing note and vitals reviewed. Constitutional: He is oriented to person, place, and time. He appears well-developed and well-nourished.  HENT:  Head: Normocephalic and atraumatic.  Right Ear: External ear normal.  Left Ear: External ear normal.  Eyes: Conjunctivae and EOM are normal. Pupils are equal, round, and reactive to light.  Neck: Normal range of motion and phonation normal. Neck supple.  Cardiovascular: Normal rate, regular rhythm, normal heart sounds and intact  distal pulses.   Hypertension is present  Pulmonary/Chest: Effort normal and breath sounds normal. He exhibits no bony tenderness.  Abdominal: Soft. Normal appearance. There is no tenderness.  Musculoskeletal: Normal range of motion.  Neurological: He is alert and oriented to person, place, and time. He has normal strength. No cranial nerve deficit or sensory deficit. He exhibits normal muscle tone. Coordination normal.  No nystagmus, no aphasia. No ataxia  Skin: Skin is warm, dry and intact.  Psychiatric: He has a normal mood and affect. His behavior is normal. Judgment and thought content normal.    ED Course  Procedures (including critical care time) Orthostatic vital sign testing is borderline positive  Medications  0.9 %  sodium chloride infusion (0 mLs Intravenous Stopped 08/08/13 2027)    Followed by  0.9 %  sodium chloride infusion (0 mLs Intravenous Stopped  08/08/13 2014)    Followed by  0.9 %  sodium chloride infusion (1,000 mLs Intravenous New Bag/Given 08/08/13 2027)     Patient Vitals for the past 24 hrs:  BP Temp Temp src Pulse Resp SpO2 Height Weight  08/08/13 2000 123/77 mmHg - - 81 - 100 % - -  08/08/13 1917 99/84 mmHg - - 91 - - - -  08/08/13 1916 119/75 mmHg - - 93 - - - -  08/08/13 1914 113/83 mmHg - - 86 - - - -  08/08/13 1749 97/75 mmHg 97.9 F (36.6 C) Oral 104 18 100 % 5\' 9"  (1.753 m) 186 lb 7 oz (84.567 kg)     Date: 08/08/13  Rate: 94  Rhythm: normal sinus rhythm  QRS Axis: normal  PR and QT Intervals: normal  ST/T Wave abnormalities: normal  PR and QRS Conduction Disutrbances:none  Narrative Interpretation:   Old EKG Reviewed: unchanged- 02/28/12   8:32 PM-Consult complete with Dr. Karilyn Cota. Patient case explained and discussed. He agrees to admit patient for further evaluation and treatment. Call ended at 2050  Labs Review Labs Reviewed  CBC WITH DIFFERENTIAL - Abnormal; Notable for the following:    HCT 37.2 (*)    All other components within normal limits  COMPREHENSIVE METABOLIC PANEL - Abnormal; Notable for the following:    Sodium 124 (*)    Chloride 82 (*)    Glucose, Bld 111 (*)    BUN 43 (*)    Creatinine, Ser 2.88 (*)    Total Bilirubin 0.2 (*)    GFR calc non Af Amer 22 (*)    GFR calc Af Amer 26 (*)    All other components within normal limits  URINALYSIS, ROUTINE W REFLEX MICROSCOPIC - Abnormal; Notable for the following:    Specific Gravity, Urine <1.005 (*)    All other components within normal limits  URINE CULTURE   Imaging Review No results found.  MDM   1. Hypotension   2. Acute kidney injury   3. Dehydration      Evaluation is consistent with acute kidney injury likely secondary to combined treatment of ACE inhibitor and diuretic therapy. It may be accompanied by a component of nutritional intake. He has coincidental hyponatremia and hypochloremia.  He has lost more than 50% of  his renal function. He'll need to be admitted for observation and fluid replenishment and modification of his prescribed medications  Nursing Notes Reviewed/ Care Coordinated, and agree without changes. Applicable Imaging Reviewed.  Interpretation of Laboratory Data incorporated into ED treatment  Plan: Admit     Flint Melter, MD 08/08/13 2052

## 2013-08-08 NOTE — ED Notes (Signed)
Gave patient a cup of water as per nurse.

## 2013-08-09 DIAGNOSIS — N179 Acute kidney failure, unspecified: Secondary | ICD-10-CM | POA: Diagnosis not present

## 2013-08-09 DIAGNOSIS — R55 Syncope and collapse: Secondary | ICD-10-CM

## 2013-08-09 DIAGNOSIS — I959 Hypotension, unspecified: Secondary | ICD-10-CM | POA: Diagnosis not present

## 2013-08-09 DIAGNOSIS — E860 Dehydration: Secondary | ICD-10-CM | POA: Diagnosis not present

## 2013-08-09 LAB — COMPREHENSIVE METABOLIC PANEL
Albumin: 3.4 g/dL — ABNORMAL LOW (ref 3.5–5.2)
Alkaline Phosphatase: 69 U/L (ref 39–117)
BUN: 34 mg/dL — ABNORMAL HIGH (ref 6–23)
Calcium: 9.3 mg/dL (ref 8.4–10.5)
Creatinine, Ser: 2.39 mg/dL — ABNORMAL HIGH (ref 0.50–1.35)
GFR calc Af Amer: 32 mL/min — ABNORMAL LOW (ref >90)
GFR calc non Af Amer: 28 mL/min — ABNORMAL LOW (ref >90)
Glucose, Bld: 96 mg/dL (ref 70–99)
Total Protein: 7 g/dL (ref 6.0–8.3)

## 2013-08-09 LAB — CBC
Hemoglobin: 11.8 g/dL — ABNORMAL LOW (ref 13.0–17.0)
MCH: 28.2 pg (ref 26.0–34.0)
MCV: 81.8 fL (ref 78.0–100.0)
RBC: 4.18 MIL/uL — ABNORMAL LOW (ref 4.22–5.81)
RDW: 11.9 % (ref 11.5–15.5)
WBC: 6.5 10*3/uL (ref 4.0–10.5)

## 2013-08-09 LAB — CK
Total CK: 62 U/L (ref 7–232)
Total CK: 81 U/L (ref 7–232)

## 2013-08-09 LAB — TSH: TSH: 1.507 u[IU]/mL (ref 0.350–4.500)

## 2013-08-09 MED ORDER — SENNOSIDES-DOCUSATE SODIUM 8.6-50 MG PO TABS
1.0000 | ORAL_TABLET | Freq: Two times a day (BID) | ORAL | Status: DC
  Administered 2013-08-09 – 2013-08-10 (×2): 1 via ORAL
  Filled 2013-08-09 (×2): qty 1

## 2013-08-09 MED ORDER — INFLUENZA VAC SPLIT QUAD 0.5 ML IM SUSP
0.5000 mL | Freq: Once | INTRAMUSCULAR | Status: AC
  Administered 2013-08-09: 0.5 mL via INTRAMUSCULAR
  Filled 2013-08-09: qty 0.5

## 2013-08-09 NOTE — Evaluation (Signed)
Physical Therapy Evaluation Patient Details Name: Robert Davidson MRN: [id number] DOB: Mar 30, 1951 Today's Date: 08/09/2013 Time: 1544-1600 PT Time Calculation (min): 16 min  PT Assessment / Plan / Recommendation History of Present Illness  Pt is admitted with renal failure due to dehydration.  He suffers from chronic back pain and has recently had orthostatic hypotension with dizziness due to dehydration.  Clinical Impression   Pt was seen for a brief evaluation.  He reports that dizziness has resolved and he feels much better.  His balance was evaluated and he had no apparent problems, gait is stable.  No dizziness occurred.    PT Assessment  Patent does not need any further PT services    Follow Up Recommendations  No PT follow up    Does the patient have the potential to tolerate intense rehabilitation      Barriers to Discharge        Equipment Recommendations  None recommended by PT    Recommendations for Other Services     Frequency      Precautions / Restrictions Precautions Precautions: None Restrictions Weight Bearing Restrictions: No   Pertinent Vitals/Pain       Mobility  Bed Mobility Bed Mobility: Not assessed Transfers Transfers: Sit to Stand;Stand to Sit Sit to Stand: 7: Independent;From chair/3-in-1;Without upper extremity assist Stand to Sit: 7: Independent;To chair/3-in-1;Without upper extremity assist Ambulation/Gait Ambulation/Gait Assistance: 7: Independent Ambulation Distance (Feet): 100 Feet Assistive device: None Gait Pattern: Within Functional Limits Gait velocity: WNL Stairs: No Wheelchair Mobility Wheelchair Mobility: No    Exercises     PT Diagnosis:    PT Problem List:   PT Treatment Interventions:       PT Goals(Current goals can be found in the care plan section) Acute Rehab PT Goals PT Goal Formulation: No goals set, d/c therapy  Visit Information  Last PT Received On: 08/09/13 History of Present Illness: Pt is admitted  with renal failure due to dehydration.  He suffers from chronic back pain and has recently had orthostatic hypotension with dizziness due to dehydration.       Prior Functioning  Home Living Family/patient expects to be discharged to:: Private residence Living Arrangements: Alone Available Help at Discharge: Family;Available PRN/intermittently Type of Home: House Home Access: Level entry Home Layout: One level Home Equipment: Cane - single point Prior Function Level of Independence: Independent Communication Communication: No difficulties    Cognition  Cognition Arousal/Alertness: Awake/alert Behavior During Therapy: WFL for tasks assessed/performed Overall Cognitive Status: Within Functional Limits for tasks assessed    Extremity/Trunk Assessment Lower Extremity Assessment Lower Extremity Assessment: Overall WFL for tasks assessed Cervical / Trunk Assessment Cervical / Trunk Assessment: Normal   Balance Balance Balance Assessed: Yes Dynamic Standing Balance Dynamic Standing - Balance Support: No upper extremity supported Dynamic Standing - Level of Assistance: 7: Independent  End of Session PT - End of Session Activity Tolerance: Patient tolerated treatment well Patient left: in chair;with call bell/phone within reach  GP     Myrlene Broker L 08/09/2013, 4:00 PM

## 2013-08-09 NOTE — Progress Notes (Signed)
[facility] PROGRESS NOTE  Robert Davidson:[id number] DOB: 1950/11/30 DOA: 08/08/2013 PCP: Sissy Hoff, MD  Assessment/Plan: 1. Acute renal failure. Secondary to prerenal azotemia/hypovolemia. Improved with fluid challenge. His creatinine trended down from 2.88 on admission to 2.39 on this mornings lab work. His hydrochlorothiazide and lisinopril have been discontinued. I suspect the cause of his acute renal failure to be combination of diuretic therapy and decreased by mouth intake. Continue IV fluid resuscitation with normal saline, followup on repeat basic metabolic panel in a.m. 2. Hypertension. Given the presence of hypotension, his ACE inhibitor and diuretic were discontinued on admission. His systolic blood pressures remaining in the 100s to 120s off of antihypertensive agents. We'll continue monitoring his vitals, reevaluate the need for future antihypertensive agents. 3. Syncope. Likely secondary to hypovolemia. Patient reports feeling better, standing at bedside today denied dizziness or lightheadedness. Will check orthostatics in a.m. 4. Chronic back pain. Patient with history of chronic back pain, narcotic dependent. 5. Hyponatremia. Likely secondary to hypotonic hypovolemic hyponatremia, as patient presenting with dehydration and syncope. Sodium improved from 124 to 132 on this mornings lab work, continue IV fluid resuscitation.   Code Status: Full code Family Communication: Plan discussed with patient at bedside Disposition Plan: Continue IV fluid resuscitation, encourage oral intake, I anticipate discharge in the next 24 hours    HPI/Subjective: Patient was admitted overnight, presented with generalized weakness, having a fall at home, clinically dehydrated. An initial lab work showed the presence of acute renal failure, with a creatinine of 2.88. Symptoms attributed to prerenal azotemia/hypovolemia, was admitted to MedSurg where he was started on IV fluid resuscitation. He  currently has normal saline running at 125 ML's per hour. His lisinopril and hydrochlorothiazide have been discontinued. Blood pressures remained stable. This morning he reports feeling better, he was assisted out of bed to bedside chair. Tolerating by mouth intake.  Objective: Filed Vitals:   08/09/13 0539  BP: 100/60  Pulse: 86  Temp: 98.8 F (37.1 C)  Resp: 18    Intake/Output Summary (Last 24 hours) at 08/09/13 1152 Last data filed at 08/09/13 0124  Gross per 24 hour  Intake   2000 ml  Output    950 ml  Net   1050 ml   Filed Weights   08/08/13 1749 08/08/13 2157  Weight: 84.567 kg (186 lb 7 oz) 86.501 kg (190 lb 11.2 oz)    Exam:   General:  Patient is in no acute distress, she was assisted out of bed to chair today, reports feeling better. He has not had further episodes of syncope  Cardiovascular: Regular rate and rhythm normal S1-S2 no murmurs rubs or gallops  Respiratory: Lungs are clear to auscultation bilaterally no wheezing rhonchi or rales  Abdomen: Abdomen is soft nontender nondistended positive bowel sounds  Musculoskeletal: Present range of motion to all extremities, patient reporting lower back pain which he feels was aggravated by his fall yesterday.   Data Reviewed: Basic Metabolic Panel:  Recent Labs Lab 08/08/13 1827 08/09/13 0548  NA 124* 132*  K 3.9 3.8  CL 82* 94*  CO2 30 28  GLUCOSE 111* 96  BUN 43* 34*  CREATININE 2.88* 2.39*  CALCIUM 10.5 9.3   Liver Function Tests:  Recent Labs Lab 08/08/13 1827 08/09/13 0548  AST 24 18  ALT 21 16  ALKPHOS 82 69  BILITOT 0.2* 0.3  PROT 8.3 7.0  ALBUMIN 4.0 3.4*   No results found for this basename: LIPASE, AMYLASE,  in the last  168 hours No results found for this basename: AMMONIA,  in the last 168 hours CBC:  Recent Labs Lab 08/08/13 1827 08/09/13 0548  WBC 7.9 6.5  NEUTROABS 5.4  --   HGB 13.0 11.8*  HCT 37.2* 34.2*  MCV 81.6 81.8  PLT 321 274   Cardiac Enzymes:  Recent  Labs Lab 08/08/13 2117 08/09/13 0548  CKTOTAL 59 62   BNP (last 3 results) No results found for this basename: PROBNP,  in the last 8760 hours CBG: No results found for this basename: GLUCAP,  in the last 168 hours  No results found for this or any previous visit (from the past 240 hour(s)).   Studies: No results found.  Scheduled Meds: . heparin  5,000 Units Subcutaneous Q8H  . influenza vac split quadrivalent PF  0.5 mL Intramuscular Once  . venlafaxine XR  75 mg Oral Daily   Continuous Infusions: . sodium chloride 1,000 mL (08/08/13 2027)    Active Problems:   Depression   Chronic back pain   HTN (hypertension)   ARF (acute renal failure)    Time spent: 35 minutes    Jeralyn Bennett  [facility] Pager [phone]. If 7PM-7AM, please contact night-coverage at [email or web address], password [facility] 08/09/2013, 11:52 AM  LOS: 1 day

## 2013-08-09 NOTE — Progress Notes (Signed)
UR Chart Review Completed  

## 2013-08-09 NOTE — Care Management Note (Signed)
    Page 1 of 1   08/10/2013     2:28:29 PM   CARE MANAGEMENT NOTE 08/10/2013  Patient:  Robert Davidson, Robert Davidson   Account Number:  [id number]  Date Initiated:  08/09/2013  Documentation initiated by:  Rosemary Holms  Subjective/Objective Assessment:   Pt admitted from home where he lives alone. PCP is with [facility]. Unsure regarding HH/DME     Action/Plan:   Anticipated DC Date:  08/11/2013   Anticipated DC Plan:        DC Planning Services  CM consult      Choice offered to / List presented to:             Status of service:  Completed, signed off Medicare Important Message given?   (If response is "NO", the following Medicare IM given date fields will be blank) Date Medicare IM given:   Date Additional Medicare IM given:    Discharge Disposition:    Per UR Regulation:    If discussed at Long Length of Stay Meetings, dates discussed:    Comments:  08/10/13 Rosemary Holms RN BNS CM Pt eager to DC. No needs for HH/DME identified  08/09/13 Rosemary Holms RN BSN CM

## 2013-08-10 DIAGNOSIS — M810 Age-related osteoporosis without current pathological fracture: Secondary | ICD-10-CM | POA: Diagnosis not present

## 2013-08-10 DIAGNOSIS — I10 Essential (primary) hypertension: Secondary | ICD-10-CM | POA: Diagnosis not present

## 2013-08-10 DIAGNOSIS — N179 Acute kidney failure, unspecified: Secondary | ICD-10-CM | POA: Diagnosis not present

## 2013-08-10 DIAGNOSIS — I959 Hypotension, unspecified: Secondary | ICD-10-CM | POA: Diagnosis not present

## 2013-08-10 LAB — BASIC METABOLIC PANEL
BUN: 23 mg/dL (ref 6–23)
Calcium: 9.2 mg/dL (ref 8.4–10.5)
Chloride: 97 mEq/L (ref 96–112)
Creatinine, Ser: 1.81 mg/dL — ABNORMAL HIGH (ref 0.50–1.35)
GFR calc non Af Amer: 39 mL/min — ABNORMAL LOW (ref >90)
Glucose, Bld: 90 mg/dL (ref 70–99)
Potassium: 4.2 mEq/L (ref 3.5–5.1)
Sodium: 132 mEq/L — ABNORMAL LOW (ref 135–145)

## 2013-08-10 LAB — URINE CULTURE: Colony Count: NO GROWTH

## 2013-08-10 NOTE — Discharge Summary (Signed)
Physician Discharge Summary  Robert Davidson ZOX:[id number] DOB: 05/15/1951 DOA: 08/08/2013  PCP: Robert Hoff, MD  Admit date: 08/08/2013 Discharge date: 08/10/2013  Time spent: 40 minutes  Recommendations for Outpatient Follow-up:  1. Follow up with primary care physician in 1 week with repeat BMET  Discharge Diagnoses:  Active Problems:   Depression   Chronic back pain   HTN (hypertension)   ARF (acute renal failure) Dehydration Orthostasis Hyponatremia  Discharge Condition: improved  Diet recommendation: low salt  Filed Weights   08/08/13 1749 08/08/13 2157  Weight: 84.567 kg (186 lb 7 oz) 86.501 kg (190 lb 11.2 oz)    History of present illness:  Robert Davidson is a 62 y.o. male who gives a 2 week history of episodes of sudden dizziness associated with syncopal episode. He never loses consciousness but he just falls to the ground upon standing up from a sitting or lying position. He thought he had ear problems and when he went to see a ENT surgeon, his blood pressure was noted to be low and therefore was referred to the emergency room. In the emergency room, he was found to have a blood pressure recorded 97 systolic and he was also found to be in acute renal failure. He does have a history of hypertension and is on ACE inhibitor with her thiazide diuretic. He denies changing his medications in the last couple weeks. He denies reduced oral intake. He does have a history of depression and takes Effexor. There is no chest pain, dyspnea, palpitations or limb weakness.   Hospital Course:  This patient was admitted to the hospital with hypotension and acute renal failure. He was noted to be dehydrated. He is on ACE inhibitor and hydrochlorothiazide. These were held on admission patient was given IV fluid. He was also significantly hyponatremic from volume depletion. The patient quickly improved with IV fluids. His creatinine has gone from 2.8 on admission to 1.8 today. The patient has  significantly improved, is ambulating without any difficulty and has no signs of orthostasis. He's been advised to keep himself hydrated and limit his salt intake. Hydrochlorothiazide and lisinopril has been held and can be readdressed by her primary care physician as felt appropriate. He'll need a repeat basic metabolic panel in one week to ensure that his renal function has further stabilized.  Procedures:  none  Consultations:  none  Discharge Exam: Filed Vitals:   08/10/13 0505  BP: 93/62  Pulse: 78  Temp:   Resp: 18    General: NAD Cardiovascular: S1, S2 RRR Respiratory: CTA B  Discharge Instructions  Discharge Orders   Future Orders Complete By Expires   Call MD for:  extreme fatigue  As directed    Call MD for:  persistant dizziness or light-headedness  As directed    Diet - low sodium heart healthy  As directed    Increase activity slowly  As directed        Medication List    STOP taking these medications       lisinopril-hydrochlorothiazide 20-25 MG per tablet  Commonly known as:  PRINZIDE,ZESTORETIC      TAKE these medications       atorvastatin 10 MG tablet  Commonly known as:  LIPITOR  Take 10 mg by mouth daily.     CALCIUM 600 + D PO  Take 1 tablet by mouth 2 (two) times daily.     cyclobenzaprine 10 MG tablet  Commonly known as:  FLEXERIL  Take 10 mg  by mouth 3 (three) times daily as needed. For muscle spasms     diazepam 5 MG tablet  Commonly known as:  VALIUM  Take 5 mg by mouth every 6 (six) hours as needed. For anxiety     oxycodone 30 MG immediate release tablet  Commonly known as:  ROXICODONE  Take 30 mg by mouth every 4 (four) hours as needed. For pain     venlafaxine XR 75 MG 24 hr capsule  Commonly known as:  EFFEXOR-XR  Take 75 mg by mouth daily.       Allergies  Allergen Reactions  . Rifampin Other (See Comments)    unknown  . Sulfa Drugs Cross Reactors Other (See Comments)    unknown       Follow-up Information    Follow up with Robert Hoff, MD. Schedule an appointment as soon as possible for a visit in 1 week.   Specialty:  Family Medicine   Contact information:   [address] [place] [address] [phone]        The results of significant diagnostics from this hospitalization (including imaging, microbiology, ancillary and laboratory) are listed below for reference.    Significant Diagnostic Studies: No results found.  Microbiology: Recent Results (from the past 240 hour(s))  URINE CULTURE     Status: None   Collection Time    08/08/13  7:46 PM      Result Value Range Status   Specimen Description URINE, CLEAN CATCH   Final   Special Requests NONE   Final   Culture  Setup Time     Final   Value: 08/08/2013 20:00     Performed at [facility] Count     Final   Value: NO GROWTH     Performed at [facility]   Culture     Final   Value: NO GROWTH     Performed at [facility]   Report Status 08/10/2013 FINAL   Final     Labs: Basic Metabolic Panel:  Recent Labs Lab 08/08/13 1827 08/09/13 0548 08/10/13 0537  NA 124* 132* 132*  K 3.9 3.8 4.2  CL 82* 94* 97  CO2 30 28 28   GLUCOSE 111* 96 90  BUN 43* 34* 23  CREATININE 2.88* 2.39* 1.81*  CALCIUM 10.5 9.3 9.2   Liver Function Tests:  Recent Labs Lab 08/08/13 1827 08/09/13 0548  AST 24 18  ALT 21 16  ALKPHOS 82 69  BILITOT 0.2* 0.3  PROT 8.3 7.0  ALBUMIN 4.0 3.4*   No results found for this basename: LIPASE, AMYLASE,  in the last 168 hours No results found for this basename: AMMONIA,  in the last 168 hours CBC:  Recent Labs Lab 08/08/13 1827 08/09/13 0548  WBC 7.9 6.5  NEUTROABS 5.4  --   HGB 13.0 11.8*  HCT 37.2* 34.2*  MCV 81.6 81.8  PLT 321 274   Cardiac Enzymes:  Recent Labs Lab 08/08/13 2117 08/09/13 0548 08/09/13 1324 08/10/13 0537  CKTOTAL 59 62 81 98   BNP: BNP (last 3 results) No results found for this basename: PROBNP,  in the last  8760 hours CBG: No results found for this basename: GLUCAP,  in the last 168 hours     Signed:  MEMON,JEHANZEB  [facility] 08/10/2013, 3:26 PM

## 2013-08-10 NOTE — Progress Notes (Signed)
Patient ambulated halls well.  No complaints of dizziness, feeling light headed, or weak.  Patient states he feels good.

## 2013-08-10 NOTE — Progress Notes (Signed)
Patient discharged home.  IV removed - WNL.  Follow up in place with PCP.  Instructed to stop taking BP med at home.  Educated on orthostatic hypotension and instructed on how to prevent dizziness when standing.   Pt verbalizes understanding, no questions at this time.  Signed up for my chart.  Patient left floor in stable condition via WC

## 2013-08-11 LAB — ACTH STIMULATION, 3 TIME POINTS
Cortisol, 60 Min: 29.7 ug/dL (ref >20)
Cortisol, Base: 21.2 ug/dL

## 2013-08-16 DIAGNOSIS — G894 Chronic pain syndrome: Secondary | ICD-10-CM | POA: Diagnosis not present

## 2013-08-16 DIAGNOSIS — E782 Mixed hyperlipidemia: Secondary | ICD-10-CM | POA: Diagnosis not present

## 2013-08-16 DIAGNOSIS — H8190 Unspecified disorder of vestibular function, unspecified ear: Secondary | ICD-10-CM | POA: Diagnosis not present

## 2013-08-16 DIAGNOSIS — R55 Syncope and collapse: Secondary | ICD-10-CM | POA: Diagnosis not present

## 2013-08-16 DIAGNOSIS — I10 Essential (primary) hypertension: Secondary | ICD-10-CM | POA: Diagnosis not present

## 2013-08-16 DIAGNOSIS — H81399 Other peripheral vertigo, unspecified ear: Secondary | ICD-10-CM | POA: Diagnosis not present

## 2013-08-16 DIAGNOSIS — F411 Generalized anxiety disorder: Secondary | ICD-10-CM | POA: Diagnosis not present

## 2013-08-16 DIAGNOSIS — M542 Cervicalgia: Secondary | ICD-10-CM | POA: Diagnosis not present

## 2013-08-16 DIAGNOSIS — G541 Lumbosacral plexus disorders: Secondary | ICD-10-CM | POA: Diagnosis not present

## 2013-08-16 DIAGNOSIS — N183 Chronic kidney disease, stage 3 unspecified: Secondary | ICD-10-CM | POA: Diagnosis not present

## 2013-09-07 DIAGNOSIS — H8190 Unspecified disorder of vestibular function, unspecified ear: Secondary | ICD-10-CM | POA: Diagnosis not present

## 2013-09-07 DIAGNOSIS — H81399 Other peripheral vertigo, unspecified ear: Secondary | ICD-10-CM | POA: Diagnosis not present

## 2013-09-07 DIAGNOSIS — G541 Lumbosacral plexus disorders: Secondary | ICD-10-CM | POA: Diagnosis not present

## 2013-09-07 DIAGNOSIS — M545 Low back pain: Secondary | ICD-10-CM | POA: Diagnosis not present

## 2013-09-07 DIAGNOSIS — M542 Cervicalgia: Secondary | ICD-10-CM | POA: Diagnosis not present

## 2013-09-07 DIAGNOSIS — Z79899 Other long term (current) drug therapy: Secondary | ICD-10-CM | POA: Diagnosis not present

## 2013-10-05 DIAGNOSIS — Z79899 Other long term (current) drug therapy: Secondary | ICD-10-CM | POA: Diagnosis not present

## 2013-10-05 DIAGNOSIS — M542 Cervicalgia: Secondary | ICD-10-CM | POA: Diagnosis not present

## 2013-10-05 DIAGNOSIS — M545 Low back pain: Secondary | ICD-10-CM | POA: Diagnosis not present

## 2013-10-05 DIAGNOSIS — H81399 Other peripheral vertigo, unspecified ear: Secondary | ICD-10-CM | POA: Diagnosis not present

## 2013-10-05 DIAGNOSIS — G541 Lumbosacral plexus disorders: Secondary | ICD-10-CM | POA: Diagnosis not present

## 2013-10-05 DIAGNOSIS — H8190 Unspecified disorder of vestibular function, unspecified ear: Secondary | ICD-10-CM | POA: Diagnosis not present

## 2013-10-05 DIAGNOSIS — IMO0002 Reserved for concepts with insufficient information to code with codable children: Secondary | ICD-10-CM | POA: Diagnosis not present

## 2013-10-05 DIAGNOSIS — M5430 Sciatica, unspecified side: Secondary | ICD-10-CM | POA: Diagnosis not present

## 2013-11-02 DIAGNOSIS — M5030 Other cervical disc degeneration, unspecified cervical region: Secondary | ICD-10-CM | POA: Diagnosis not present

## 2013-11-02 DIAGNOSIS — S335XXA Sprain of ligaments of lumbar spine, initial encounter: Secondary | ICD-10-CM | POA: Diagnosis not present

## 2013-11-02 DIAGNOSIS — M5137 Other intervertebral disc degeneration, lumbosacral region: Secondary | ICD-10-CM | POA: Diagnosis not present

## 2013-11-02 DIAGNOSIS — M533 Sacrococcygeal disorders, not elsewhere classified: Secondary | ICD-10-CM | POA: Diagnosis not present

## 2013-11-02 DIAGNOSIS — Z79899 Other long term (current) drug therapy: Secondary | ICD-10-CM | POA: Diagnosis not present

## 2013-11-02 DIAGNOSIS — M545 Low back pain: Secondary | ICD-10-CM | POA: Diagnosis not present

## 2013-11-02 DIAGNOSIS — S139XXA Sprain of joints and ligaments of unspecified parts of neck, initial encounter: Secondary | ICD-10-CM | POA: Diagnosis not present

## 2013-11-02 DIAGNOSIS — M546 Pain in thoracic spine: Secondary | ICD-10-CM | POA: Diagnosis not present

## 2013-12-05 DIAGNOSIS — M545 Low back pain, unspecified: Secondary | ICD-10-CM | POA: Diagnosis not present

## 2013-12-05 DIAGNOSIS — M5430 Sciatica, unspecified side: Secondary | ICD-10-CM | POA: Diagnosis not present

## 2013-12-05 DIAGNOSIS — M533 Sacrococcygeal disorders, not elsewhere classified: Secondary | ICD-10-CM | POA: Diagnosis not present

## 2013-12-05 DIAGNOSIS — M5137 Other intervertebral disc degeneration, lumbosacral region: Secondary | ICD-10-CM | POA: Diagnosis not present

## 2013-12-05 DIAGNOSIS — S335XXA Sprain of ligaments of lumbar spine, initial encounter: Secondary | ICD-10-CM | POA: Diagnosis not present

## 2013-12-05 DIAGNOSIS — S139XXA Sprain of joints and ligaments of unspecified parts of neck, initial encounter: Secondary | ICD-10-CM | POA: Diagnosis not present

## 2013-12-05 DIAGNOSIS — M5030 Other cervical disc degeneration, unspecified cervical region: Secondary | ICD-10-CM | POA: Diagnosis not present

## 2013-12-07 DIAGNOSIS — K612 Anorectal abscess: Secondary | ICD-10-CM | POA: Diagnosis not present

## 2013-12-08 DIAGNOSIS — N498 Inflammatory disorders of other specified male genital organs: Secondary | ICD-10-CM | POA: Diagnosis not present

## 2013-12-21 DIAGNOSIS — N498 Inflammatory disorders of other specified male genital organs: Secondary | ICD-10-CM | POA: Diagnosis not present

## 2014-01-09 DIAGNOSIS — M533 Sacrococcygeal disorders, not elsewhere classified: Secondary | ICD-10-CM | POA: Diagnosis not present

## 2014-01-09 DIAGNOSIS — M5137 Other intervertebral disc degeneration, lumbosacral region: Secondary | ICD-10-CM | POA: Diagnosis not present

## 2014-01-09 DIAGNOSIS — M5030 Other cervical disc degeneration, unspecified cervical region: Secondary | ICD-10-CM | POA: Diagnosis not present

## 2014-01-09 DIAGNOSIS — M545 Low back pain, unspecified: Secondary | ICD-10-CM | POA: Diagnosis not present

## 2014-01-09 DIAGNOSIS — Z79899 Other long term (current) drug therapy: Secondary | ICD-10-CM | POA: Diagnosis not present

## 2014-01-23 DIAGNOSIS — M899 Disorder of bone, unspecified: Secondary | ICD-10-CM | POA: Diagnosis not present

## 2014-01-23 DIAGNOSIS — G894 Chronic pain syndrome: Secondary | ICD-10-CM | POA: Diagnosis not present

## 2014-01-23 DIAGNOSIS — M949 Disorder of cartilage, unspecified: Secondary | ICD-10-CM | POA: Diagnosis not present

## 2014-01-23 DIAGNOSIS — F411 Generalized anxiety disorder: Secondary | ICD-10-CM | POA: Diagnosis not present

## 2014-01-23 DIAGNOSIS — E782 Mixed hyperlipidemia: Secondary | ICD-10-CM | POA: Diagnosis not present

## 2014-01-23 DIAGNOSIS — N183 Chronic kidney disease, stage 3 unspecified: Secondary | ICD-10-CM | POA: Diagnosis not present

## 2014-01-23 DIAGNOSIS — I10 Essential (primary) hypertension: Secondary | ICD-10-CM | POA: Diagnosis not present

## 2014-01-26 DIAGNOSIS — M5412 Radiculopathy, cervical region: Secondary | ICD-10-CM | POA: Diagnosis not present

## 2014-01-26 DIAGNOSIS — M545 Low back pain, unspecified: Secondary | ICD-10-CM | POA: Diagnosis not present

## 2014-01-26 DIAGNOSIS — Z79899 Other long term (current) drug therapy: Secondary | ICD-10-CM | POA: Diagnosis not present

## 2014-01-26 DIAGNOSIS — M5430 Sciatica, unspecified side: Secondary | ICD-10-CM | POA: Diagnosis not present

## 2014-01-26 DIAGNOSIS — M542 Cervicalgia: Secondary | ICD-10-CM | POA: Diagnosis not present

## 2014-01-26 DIAGNOSIS — M533 Sacrococcygeal disorders, not elsewhere classified: Secondary | ICD-10-CM | POA: Diagnosis not present

## 2014-02-23 DIAGNOSIS — M545 Low back pain, unspecified: Secondary | ICD-10-CM | POA: Diagnosis not present

## 2014-02-23 DIAGNOSIS — Z79899 Other long term (current) drug therapy: Secondary | ICD-10-CM | POA: Diagnosis not present

## 2014-02-23 DIAGNOSIS — M533 Sacrococcygeal disorders, not elsewhere classified: Secondary | ICD-10-CM | POA: Diagnosis not present

## 2014-02-23 DIAGNOSIS — M5430 Sciatica, unspecified side: Secondary | ICD-10-CM | POA: Diagnosis not present

## 2014-04-06 DIAGNOSIS — M5430 Sciatica, unspecified side: Secondary | ICD-10-CM | POA: Diagnosis not present

## 2014-04-06 DIAGNOSIS — M545 Low back pain, unspecified: Secondary | ICD-10-CM | POA: Diagnosis not present

## 2014-04-06 DIAGNOSIS — M533 Sacrococcygeal disorders, not elsewhere classified: Secondary | ICD-10-CM | POA: Diagnosis not present

## 2014-04-06 DIAGNOSIS — M5412 Radiculopathy, cervical region: Secondary | ICD-10-CM | POA: Diagnosis not present

## 2014-04-06 DIAGNOSIS — Z79899 Other long term (current) drug therapy: Secondary | ICD-10-CM | POA: Diagnosis not present

## 2014-04-06 DIAGNOSIS — G894 Chronic pain syndrome: Secondary | ICD-10-CM | POA: Diagnosis not present

## 2014-04-06 DIAGNOSIS — M542 Cervicalgia: Secondary | ICD-10-CM | POA: Diagnosis not present

## 2014-05-04 DIAGNOSIS — M5430 Sciatica, unspecified side: Secondary | ICD-10-CM | POA: Diagnosis not present

## 2014-05-04 DIAGNOSIS — M545 Low back pain, unspecified: Secondary | ICD-10-CM | POA: Diagnosis not present

## 2014-05-04 DIAGNOSIS — Z79899 Other long term (current) drug therapy: Secondary | ICD-10-CM | POA: Diagnosis not present

## 2014-05-04 DIAGNOSIS — M533 Sacrococcygeal disorders, not elsewhere classified: Secondary | ICD-10-CM | POA: Diagnosis not present

## 2014-05-04 DIAGNOSIS — G894 Chronic pain syndrome: Secondary | ICD-10-CM | POA: Diagnosis not present

## 2014-06-01 DIAGNOSIS — M545 Low back pain, unspecified: Secondary | ICD-10-CM | POA: Diagnosis not present

## 2014-06-01 DIAGNOSIS — M25559 Pain in unspecified hip: Secondary | ICD-10-CM | POA: Diagnosis not present

## 2014-06-29 DIAGNOSIS — M533 Sacrococcygeal disorders, not elsewhere classified: Secondary | ICD-10-CM | POA: Diagnosis not present

## 2014-06-29 DIAGNOSIS — M5430 Sciatica, unspecified side: Secondary | ICD-10-CM | POA: Diagnosis not present

## 2014-06-29 DIAGNOSIS — G894 Chronic pain syndrome: Secondary | ICD-10-CM | POA: Diagnosis not present

## 2014-06-29 DIAGNOSIS — M545 Low back pain, unspecified: Secondary | ICD-10-CM | POA: Diagnosis not present

## 2014-06-29 DIAGNOSIS — Z79899 Other long term (current) drug therapy: Secondary | ICD-10-CM | POA: Diagnosis not present

## 2014-08-16 DIAGNOSIS — M5430 Sciatica, unspecified side: Secondary | ICD-10-CM | POA: Diagnosis not present

## 2014-08-16 DIAGNOSIS — M533 Sacrococcygeal disorders, not elsewhere classified: Secondary | ICD-10-CM | POA: Diagnosis not present

## 2014-08-16 DIAGNOSIS — M545 Low back pain, unspecified: Secondary | ICD-10-CM | POA: Diagnosis not present

## 2014-08-16 DIAGNOSIS — G894 Chronic pain syndrome: Secondary | ICD-10-CM | POA: Diagnosis not present

## 2014-08-16 DIAGNOSIS — Z79899 Other long term (current) drug therapy: Secondary | ICD-10-CM | POA: Diagnosis not present

## 2014-09-06 DIAGNOSIS — G8929 Other chronic pain: Secondary | ICD-10-CM | POA: Diagnosis not present

## 2014-09-06 DIAGNOSIS — M25561 Pain in right knee: Secondary | ICD-10-CM | POA: Diagnosis not present

## 2014-09-06 DIAGNOSIS — M546 Pain in thoracic spine: Secondary | ICD-10-CM | POA: Diagnosis not present

## 2014-10-05 DIAGNOSIS — Z79891 Long term (current) use of opiate analgesic: Secondary | ICD-10-CM | POA: Diagnosis not present

## 2014-10-05 DIAGNOSIS — M545 Low back pain: Secondary | ICD-10-CM | POA: Diagnosis not present

## 2014-10-05 DIAGNOSIS — M546 Pain in thoracic spine: Secondary | ICD-10-CM | POA: Diagnosis not present

## 2014-10-05 DIAGNOSIS — M25551 Pain in right hip: Secondary | ICD-10-CM | POA: Diagnosis not present

## 2014-10-05 DIAGNOSIS — M25561 Pain in right knee: Secondary | ICD-10-CM | POA: Diagnosis not present

## 2014-10-05 DIAGNOSIS — G8929 Other chronic pain: Secondary | ICD-10-CM | POA: Diagnosis not present

## 2014-10-05 DIAGNOSIS — M25111 Fistula, right shoulder: Secondary | ICD-10-CM | POA: Diagnosis not present

## 2014-10-27 DIAGNOSIS — Z23 Encounter for immunization: Secondary | ICD-10-CM | POA: Diagnosis not present

## 2014-10-31 DIAGNOSIS — I10 Essential (primary) hypertension: Secondary | ICD-10-CM | POA: Diagnosis not present

## 2014-10-31 DIAGNOSIS — F419 Anxiety disorder, unspecified: Secondary | ICD-10-CM | POA: Diagnosis not present

## 2014-10-31 DIAGNOSIS — N183 Chronic kidney disease, stage 3 (moderate): Secondary | ICD-10-CM | POA: Diagnosis not present

## 2014-10-31 DIAGNOSIS — M8580 Other specified disorders of bone density and structure, unspecified site: Secondary | ICD-10-CM | POA: Diagnosis not present

## 2014-10-31 DIAGNOSIS — E782 Mixed hyperlipidemia: Secondary | ICD-10-CM | POA: Diagnosis not present

## 2014-10-31 DIAGNOSIS — G894 Chronic pain syndrome: Secondary | ICD-10-CM | POA: Diagnosis not present

## 2014-11-14 DIAGNOSIS — M545 Low back pain: Secondary | ICD-10-CM | POA: Diagnosis not present

## 2014-11-14 DIAGNOSIS — G894 Chronic pain syndrome: Secondary | ICD-10-CM | POA: Diagnosis not present

## 2014-11-14 DIAGNOSIS — R202 Paresthesia of skin: Secondary | ICD-10-CM | POA: Diagnosis not present

## 2014-11-14 DIAGNOSIS — M25561 Pain in right knee: Secondary | ICD-10-CM | POA: Diagnosis not present

## 2014-11-14 DIAGNOSIS — G890 Central pain syndrome: Secondary | ICD-10-CM | POA: Diagnosis not present

## 2014-12-14 DIAGNOSIS — M25561 Pain in right knee: Secondary | ICD-10-CM | POA: Diagnosis not present

## 2014-12-14 DIAGNOSIS — M545 Low back pain: Secondary | ICD-10-CM | POA: Diagnosis not present

## 2015-01-11 DIAGNOSIS — G8929 Other chronic pain: Secondary | ICD-10-CM | POA: Diagnosis not present

## 2015-01-11 DIAGNOSIS — M25561 Pain in right knee: Secondary | ICD-10-CM | POA: Diagnosis not present

## 2015-01-11 DIAGNOSIS — M545 Low back pain: Secondary | ICD-10-CM | POA: Diagnosis not present

## 2015-02-07 DIAGNOSIS — M546 Pain in thoracic spine: Secondary | ICD-10-CM | POA: Diagnosis not present

## 2015-02-07 DIAGNOSIS — G894 Chronic pain syndrome: Secondary | ICD-10-CM | POA: Diagnosis not present

## 2015-02-07 DIAGNOSIS — G541 Lumbosacral plexus disorders: Secondary | ICD-10-CM | POA: Diagnosis not present

## 2015-02-07 DIAGNOSIS — R202 Paresthesia of skin: Secondary | ICD-10-CM | POA: Diagnosis not present

## 2015-02-07 DIAGNOSIS — G603 Idiopathic progressive neuropathy: Secondary | ICD-10-CM | POA: Diagnosis not present

## 2015-02-07 DIAGNOSIS — M545 Low back pain: Secondary | ICD-10-CM | POA: Diagnosis not present

## 2015-03-08 DIAGNOSIS — M545 Low back pain: Secondary | ICD-10-CM | POA: Diagnosis not present

## 2015-03-08 DIAGNOSIS — G8929 Other chronic pain: Secondary | ICD-10-CM | POA: Diagnosis not present

## 2015-03-20 DIAGNOSIS — M899 Disorder of bone, unspecified: Secondary | ICD-10-CM | POA: Diagnosis not present

## 2015-03-20 DIAGNOSIS — M8589 Other specified disorders of bone density and structure, multiple sites: Secondary | ICD-10-CM | POA: Diagnosis not present

## 2015-04-05 DIAGNOSIS — G8929 Other chronic pain: Secondary | ICD-10-CM | POA: Diagnosis not present

## 2015-04-05 DIAGNOSIS — M545 Low back pain: Secondary | ICD-10-CM | POA: Diagnosis not present

## 2015-05-01 DIAGNOSIS — M25511 Pain in right shoulder: Secondary | ICD-10-CM | POA: Diagnosis not present

## 2015-05-01 DIAGNOSIS — M545 Low back pain: Secondary | ICD-10-CM | POA: Diagnosis not present

## 2015-05-01 DIAGNOSIS — Z79891 Long term (current) use of opiate analgesic: Secondary | ICD-10-CM | POA: Diagnosis not present

## 2015-05-01 DIAGNOSIS — G8929 Other chronic pain: Secondary | ICD-10-CM | POA: Diagnosis not present

## 2015-05-01 DIAGNOSIS — M25561 Pain in right knee: Secondary | ICD-10-CM | POA: Diagnosis not present

## 2015-05-04 DIAGNOSIS — I10 Essential (primary) hypertension: Secondary | ICD-10-CM | POA: Diagnosis not present

## 2015-05-04 DIAGNOSIS — Z125 Encounter for screening for malignant neoplasm of prostate: Secondary | ICD-10-CM | POA: Diagnosis not present

## 2015-05-04 DIAGNOSIS — N183 Chronic kidney disease, stage 3 (moderate): Secondary | ICD-10-CM | POA: Diagnosis not present

## 2015-05-04 DIAGNOSIS — K088 Other specified disorders of teeth and supporting structures: Secondary | ICD-10-CM | POA: Diagnosis not present

## 2015-05-04 DIAGNOSIS — G894 Chronic pain syndrome: Secondary | ICD-10-CM | POA: Diagnosis not present

## 2015-05-04 DIAGNOSIS — F419 Anxiety disorder, unspecified: Secondary | ICD-10-CM | POA: Diagnosis not present

## 2015-05-04 DIAGNOSIS — Z0000 Encounter for general adult medical examination without abnormal findings: Secondary | ICD-10-CM | POA: Diagnosis not present

## 2015-05-04 DIAGNOSIS — Z1211 Encounter for screening for malignant neoplasm of colon: Secondary | ICD-10-CM | POA: Diagnosis not present

## 2015-05-04 DIAGNOSIS — M8580 Other specified disorders of bone density and structure, unspecified site: Secondary | ICD-10-CM | POA: Diagnosis not present

## 2015-05-04 DIAGNOSIS — E782 Mixed hyperlipidemia: Secondary | ICD-10-CM | POA: Diagnosis not present

## 2015-05-04 DIAGNOSIS — Z1389 Encounter for screening for other disorder: Secondary | ICD-10-CM | POA: Diagnosis not present

## 2015-05-11 ENCOUNTER — Encounter: Payer: Self-pay | Admitting: Gastroenterology

## 2015-06-12 DIAGNOSIS — Z79891 Long term (current) use of opiate analgesic: Secondary | ICD-10-CM | POA: Diagnosis not present

## 2015-06-12 DIAGNOSIS — G8929 Other chronic pain: Secondary | ICD-10-CM | POA: Diagnosis not present

## 2015-06-12 DIAGNOSIS — M25561 Pain in right knee: Secondary | ICD-10-CM | POA: Diagnosis not present

## 2015-06-12 DIAGNOSIS — M545 Low back pain: Secondary | ICD-10-CM | POA: Diagnosis not present

## 2015-06-12 DIAGNOSIS — M25511 Pain in right shoulder: Secondary | ICD-10-CM | POA: Diagnosis not present

## 2015-07-10 DIAGNOSIS — M546 Pain in thoracic spine: Secondary | ICD-10-CM | POA: Diagnosis not present

## 2015-07-10 DIAGNOSIS — M545 Low back pain: Secondary | ICD-10-CM | POA: Diagnosis not present

## 2015-07-10 DIAGNOSIS — M5412 Radiculopathy, cervical region: Secondary | ICD-10-CM | POA: Diagnosis not present

## 2015-07-10 DIAGNOSIS — G890 Central pain syndrome: Secondary | ICD-10-CM | POA: Diagnosis not present

## 2015-08-02 ENCOUNTER — Encounter: Payer: Self-pay | Admitting: Internal Medicine

## 2015-08-09 DIAGNOSIS — G8929 Other chronic pain: Secondary | ICD-10-CM | POA: Diagnosis not present

## 2015-08-09 DIAGNOSIS — M545 Low back pain: Secondary | ICD-10-CM | POA: Diagnosis not present

## 2015-11-02 DIAGNOSIS — M5134 Other intervertebral disc degeneration, thoracic region: Secondary | ICD-10-CM | POA: Diagnosis not present

## 2015-11-02 DIAGNOSIS — Z23 Encounter for immunization: Secondary | ICD-10-CM | POA: Diagnosis not present

## 2015-11-02 DIAGNOSIS — G894 Chronic pain syndrome: Secondary | ICD-10-CM | POA: Diagnosis not present

## 2015-11-02 DIAGNOSIS — I10 Essential (primary) hypertension: Secondary | ICD-10-CM | POA: Diagnosis not present

## 2015-11-02 DIAGNOSIS — M8588 Other specified disorders of bone density and structure, other site: Secondary | ICD-10-CM | POA: Diagnosis not present

## 2015-11-02 DIAGNOSIS — N183 Chronic kidney disease, stage 3 (moderate): Secondary | ICD-10-CM | POA: Diagnosis not present

## 2015-11-02 DIAGNOSIS — E782 Mixed hyperlipidemia: Secondary | ICD-10-CM | POA: Diagnosis not present

## 2015-11-02 DIAGNOSIS — F419 Anxiety disorder, unspecified: Secondary | ICD-10-CM | POA: Diagnosis not present

## 2016-01-18 DIAGNOSIS — H5210 Myopia, unspecified eye: Secondary | ICD-10-CM | POA: Diagnosis not present

## 2016-01-18 DIAGNOSIS — H40009 Preglaucoma, unspecified, unspecified eye: Secondary | ICD-10-CM | POA: Diagnosis not present

## 2016-01-18 DIAGNOSIS — H5200 Hypermetropia, unspecified eye: Secondary | ICD-10-CM | POA: Diagnosis not present

## 2016-05-13 DIAGNOSIS — Z0000 Encounter for general adult medical examination without abnormal findings: Secondary | ICD-10-CM | POA: Diagnosis not present

## 2016-05-13 DIAGNOSIS — N529 Male erectile dysfunction, unspecified: Secondary | ICD-10-CM | POA: Diagnosis not present

## 2016-05-13 DIAGNOSIS — M5134 Other intervertebral disc degeneration, thoracic region: Secondary | ICD-10-CM | POA: Diagnosis not present

## 2016-05-13 DIAGNOSIS — M8580 Other specified disorders of bone density and structure, unspecified site: Secondary | ICD-10-CM | POA: Diagnosis not present

## 2016-05-13 DIAGNOSIS — N183 Chronic kidney disease, stage 3 (moderate): Secondary | ICD-10-CM | POA: Diagnosis not present

## 2016-05-13 DIAGNOSIS — R202 Paresthesia of skin: Secondary | ICD-10-CM | POA: Diagnosis not present

## 2016-05-13 DIAGNOSIS — I10 Essential (primary) hypertension: Secondary | ICD-10-CM | POA: Diagnosis not present

## 2016-05-13 DIAGNOSIS — F419 Anxiety disorder, unspecified: Secondary | ICD-10-CM | POA: Diagnosis not present

## 2016-05-13 DIAGNOSIS — Z125 Encounter for screening for malignant neoplasm of prostate: Secondary | ICD-10-CM | POA: Diagnosis not present

## 2016-05-13 DIAGNOSIS — E782 Mixed hyperlipidemia: Secondary | ICD-10-CM | POA: Diagnosis not present

## 2016-08-07 ENCOUNTER — Encounter: Payer: Commercial Managed Care - HMO | Admitting: Podiatry

## 2016-08-26 ENCOUNTER — Ambulatory Visit (INDEPENDENT_AMBULATORY_CARE_PROVIDER_SITE_OTHER): Payer: Commercial Managed Care - HMO

## 2016-08-26 ENCOUNTER — Other Ambulatory Visit: Payer: Self-pay | Admitting: *Deleted

## 2016-08-26 ENCOUNTER — Encounter: Payer: Self-pay | Admitting: Podiatry

## 2016-08-26 ENCOUNTER — Ambulatory Visit (INDEPENDENT_AMBULATORY_CARE_PROVIDER_SITE_OTHER): Payer: Commercial Managed Care - HMO | Admitting: Podiatry

## 2016-08-26 VITALS — BP 115/74 | HR 93 | Resp 16

## 2016-08-26 DIAGNOSIS — G629 Polyneuropathy, unspecified: Secondary | ICD-10-CM

## 2016-08-26 DIAGNOSIS — M79672 Pain in left foot: Secondary | ICD-10-CM

## 2016-08-26 DIAGNOSIS — M79671 Pain in right foot: Secondary | ICD-10-CM

## 2016-08-26 NOTE — Progress Notes (Signed)
   Subjective:    Patient ID: Robert Davidson, male    DOB: 11/20/1950, 65 y.o.   MRN: [id number]  HPI: He presents today with a chief complaint of toes that feels swollen and numb. He states that he still has sensation and can feel pain in touch. He does relate a history of a slipped disc with the protruding annulus at the level of T4 and was in the pain clinic for over 15 years ago and opioids and has now been opioid free tolerating the pain for the past year. He denies any trauma. States that he recently saw his primary provider who stated that he did not have any signs of diabetes including blood work which was performed and was negative.    Review of Systems  All other systems reviewed and are negative.      Objective:   Physical Exam: Vital signs are stable he is alert and oriented 373 pleasant 65-year-old white male no acute distress. Pulses are strongly palpable bilateral. Capillary fill time is immediate. No edema bilateral. Deep tendon reflexes are intact and neurologic sensorium is intact per Semmes-Weinstein monofilament bilateral. No changes in vibratory or proprioceptive sensation. No Tinel's sign. No pain on palpation of the tarsal tunnel. Orthopedic evaluation demonstrates all joints distal to the ankle for range of motion without crepitation. Cutaneous evaluation demonstrates supple well-hydrated cutis no open lesions or wounds.        Assessment & Plan:  Neuritis/neuropathy bilateral possibly associated with the back.  Plan: We do need to rule out neuropathy. I'm requesting a nerve conduction velocity exam and a EMG as well as a consult.

## 2016-08-28 DIAGNOSIS — M25561 Pain in right knee: Secondary | ICD-10-CM | POA: Diagnosis not present

## 2016-08-28 DIAGNOSIS — M170 Bilateral primary osteoarthritis of knee: Secondary | ICD-10-CM | POA: Diagnosis not present

## 2016-08-28 DIAGNOSIS — R262 Difficulty in walking, not elsewhere classified: Secondary | ICD-10-CM | POA: Diagnosis not present

## 2016-09-08 DIAGNOSIS — M25561 Pain in right knee: Secondary | ICD-10-CM | POA: Diagnosis not present

## 2016-09-08 DIAGNOSIS — M1711 Unilateral primary osteoarthritis, right knee: Secondary | ICD-10-CM | POA: Diagnosis not present

## 2016-09-08 DIAGNOSIS — R262 Difficulty in walking, not elsewhere classified: Secondary | ICD-10-CM | POA: Diagnosis not present

## 2016-09-15 DIAGNOSIS — M1711 Unilateral primary osteoarthritis, right knee: Secondary | ICD-10-CM | POA: Diagnosis not present

## 2016-09-15 DIAGNOSIS — M25561 Pain in right knee: Secondary | ICD-10-CM | POA: Diagnosis not present

## 2016-09-16 ENCOUNTER — Encounter: Payer: Medicare Other | Admitting: Neurology

## 2016-09-17 ENCOUNTER — Other Ambulatory Visit: Payer: Self-pay | Admitting: *Deleted

## 2016-09-17 DIAGNOSIS — R200 Anesthesia of skin: Secondary | ICD-10-CM

## 2016-09-23 DIAGNOSIS — M170 Bilateral primary osteoarthritis of knee: Secondary | ICD-10-CM | POA: Diagnosis not present

## 2016-09-23 DIAGNOSIS — M25561 Pain in right knee: Secondary | ICD-10-CM | POA: Diagnosis not present

## 2016-09-23 DIAGNOSIS — M25562 Pain in left knee: Secondary | ICD-10-CM | POA: Diagnosis not present

## 2016-09-27 NOTE — Progress Notes (Signed)
This encounter was created in error - please disregard.

## 2016-09-30 DIAGNOSIS — M25562 Pain in left knee: Secondary | ICD-10-CM | POA: Diagnosis not present

## 2016-09-30 DIAGNOSIS — M170 Bilateral primary osteoarthritis of knee: Secondary | ICD-10-CM | POA: Diagnosis not present

## 2016-09-30 DIAGNOSIS — M25561 Pain in right knee: Secondary | ICD-10-CM | POA: Diagnosis not present

## 2016-10-07 ENCOUNTER — Ambulatory Visit (INDEPENDENT_AMBULATORY_CARE_PROVIDER_SITE_OTHER): Payer: Commercial Managed Care - HMO | Admitting: Neurology

## 2016-10-07 DIAGNOSIS — M5417 Radiculopathy, lumbosacral region: Secondary | ICD-10-CM

## 2016-10-07 DIAGNOSIS — R200 Anesthesia of skin: Secondary | ICD-10-CM | POA: Diagnosis not present

## 2016-10-07 NOTE — Procedures (Signed)
[facility]  [address], [address]  [place], [place] [address] Tel: [phone] Fax:  [phone] Test Date:  10/07/2016  Patient: Robert LoosenGary Davidson DOB: 10/08/1951 Physician: Nita Sickleonika Demauri Advincula, DO  Sex: Male Height: 5\' 11"  Ref Phys: Dr Al CorpusHyatt  ID#: [id number] Temp: 33.4C Technician: Judie PetitM. Whiten   Patient Complaints: This is a 65 year old gentleman referred for evaluation of bilateral feet paresthesias.   NCV & EMG Findings: Extensive electrodiagnostic testing of the right lower extremity and additional studies of the left shows:  1. Bilateral sural and superficial peroneal sensory responses are within normal limits. 2. Left peroneal motor response shows reduced amplitude (1.1 mV) at the extensor digitorum brevis; however, peroneal motor response at the tibialis anterior is within normal limits. Right peroneal and bilateral tibial motor responses are within normal limits. 3. Bilateral tibial H reflex studies are within normal limits. 4. Chronic motor axon loss changes are seen affecting bilateral L5 myotomes, without accompanied active denervation.  Impression: 1. Chronic L5 radiculopathy affecting bilateral lower extremities; mild in degree electrically. 2. There is no evidence of a large fiber sensorimotor polyneuropathy affecting lower extremities.   ___________________________ Nita Sickleonika Rayaan Garguilo, DO    Nerve Conduction Studies Anti Sensory Summary Table   Site NR Peak (ms) Norm Peak (ms) P-T Amp (V) Norm P-T Amp  Left Sup Peroneal Anti Sensory (Ant Lat Mall)  12 cm    4.1 <4.6 10.0 >3  Right Sup Peroneal Anti Sensory (Ant Lat Mall)  12 cm    3.1 <4.6 10.4 >3  Left Sural Anti Sensory (Lat Mall)  Calf    4.5 <4.6 8.4 >3  Right Sural Anti Sensory (Lat Mall)  Calf    4.3 <4.6 9.7 >3   Motor Summary Table   Site NR Onset (ms) Norm Onset (ms) O-P Amp (mV) Norm O-P Amp Site1 Site2 Delta-0 (ms) Dist (cm) Vel (m/s) Norm Vel (m/s)  Left Peroneal Motor (Ext Dig Brev)  Ankle    4.6  <6.0 1.1 >2.5 B Fib Ankle 7.5 33.0 44 >40  B Fib    12.1  0.9  Poplt B Fib 1.7 10.0 59 >40  Poplt    13.8  0.8         Right Peroneal Motor (Ext Dig Brev)  Ankle    2.9 <6.0 8.2 >2.5 B Fib Ankle 7.7 33.0 43 >40  B Fib    10.6  6.8  Poplt B Fib 1.7 10.0 59 >40  Poplt    12.3  6.8         Left Peroneal TA Motor (Tib Ant)  Fib Head    2.2 <4.5 5.0 >3 Poplit Fib Head 1.8 10.0 56 >40  Poplit    4.0  4.7         Left Tibial Motor (Abd Hall Brev)  Ankle    4.5 <6.0 4.9 >4 Knee Ankle 9.0 41.0 46 >40  Knee    13.5  3.0         Right Tibial Motor (Abd Hall Brev)  Ankle    3.4 <6.0 4.3 >4 Knee Ankle 9.1 40.0 44 >40  Knee    12.5  2.7          H Reflex Studies   NR H-Lat (ms) Lat Norm (ms) L-R H-Lat (ms) M-Lat (ms) HLat-MLat (ms)  Left Tibial (Gastroc)     34.29 <35 0.68 4.49 29.80  Right Tibial (Gastroc)     34.97 <35 0.68 4.49 30.48   EMG  Side Muscle Ins Act Fibs Psw Fasc Number Recrt Dur Dur. Amp Amp. Poly Poly. Comment  Right Flex Dig Long Nml Nml Nml Nml 1- Rapid Some 1+ Some 1+ Nml Nml N/A  Right AntTibialis Nml Nml Nml Nml 1- Rapid Some 1+ Some 1+ Nml Nml N/A  Right Gastroc Nml Nml Nml Nml Nml Nml Nml Nml Nml Nml Nml Nml N/A  Right RectFemoris Nml Nml Nml Nml Nml Nml Nml Nml Nml Nml Nml Nml N/A  Right GluteusMed Nml Nml Nml Nml 1- Rapid Few 1+ Few 1+ Nml Nml N/A  Right BicepsFemS Nml Nml Nml Nml Nml Nml Nml Nml Nml Nml Nml Nml N/A  Left AntTibialis Nml Nml Nml Nml 1- Rapid Some 1+ Some 1+ Nml Nml N/A  Left BicepsFemS Nml Nml Nml Nml Nml Nml Nml Nml Nml Nml Nml Nml N/A  Left Gastroc Nml Nml Nml Nml Nml Nml Nml Nml Nml Nml Nml Nml N/A  Left Flex Dig Long Nml Nml Nml Nml 1- Rapid Some 1+ Some 1+ Nml Nml N/A  Left RectFemoris Nml Nml Nml Nml Nml Nml Nml Nml Nml Nml Nml Nml N/A  Left GluteusMed Nml Nml Nml Nml 1- Rapid Some 1+ Some 1+ Nml Nml N/A      Waveforms:

## 2016-10-07 NOTE — Progress Notes (Signed)
Make sure this pt has a consult visit with neurology

## 2016-10-08 DIAGNOSIS — M170 Bilateral primary osteoarthritis of knee: Secondary | ICD-10-CM | POA: Diagnosis not present

## 2016-10-08 DIAGNOSIS — M25561 Pain in right knee: Secondary | ICD-10-CM | POA: Diagnosis not present

## 2016-10-08 DIAGNOSIS — M25562 Pain in left knee: Secondary | ICD-10-CM | POA: Diagnosis not present

## 2016-10-14 DIAGNOSIS — M25562 Pain in left knee: Secondary | ICD-10-CM | POA: Diagnosis not present

## 2016-10-14 DIAGNOSIS — M1712 Unilateral primary osteoarthritis, left knee: Secondary | ICD-10-CM | POA: Diagnosis not present

## 2016-10-21 DIAGNOSIS — M1712 Unilateral primary osteoarthritis, left knee: Secondary | ICD-10-CM | POA: Diagnosis not present

## 2016-10-21 DIAGNOSIS — M25562 Pain in left knee: Secondary | ICD-10-CM | POA: Diagnosis not present

## 2016-12-12 DIAGNOSIS — N529 Male erectile dysfunction, unspecified: Secondary | ICD-10-CM | POA: Diagnosis not present

## 2016-12-12 DIAGNOSIS — I10 Essential (primary) hypertension: Secondary | ICD-10-CM | POA: Diagnosis not present

## 2016-12-12 DIAGNOSIS — M5134 Other intervertebral disc degeneration, thoracic region: Secondary | ICD-10-CM | POA: Diagnosis not present

## 2016-12-12 DIAGNOSIS — M8580 Other specified disorders of bone density and structure, unspecified site: Secondary | ICD-10-CM | POA: Diagnosis not present

## 2016-12-12 DIAGNOSIS — D582 Other hemoglobinopathies: Secondary | ICD-10-CM | POA: Diagnosis not present

## 2016-12-12 DIAGNOSIS — N183 Chronic kidney disease, stage 3 (moderate): Secondary | ICD-10-CM | POA: Diagnosis not present

## 2016-12-12 DIAGNOSIS — E782 Mixed hyperlipidemia: Secondary | ICD-10-CM | POA: Diagnosis not present

## 2016-12-12 DIAGNOSIS — F419 Anxiety disorder, unspecified: Secondary | ICD-10-CM | POA: Diagnosis not present

## 2017-01-27 DIAGNOSIS — M25561 Pain in right knee: Secondary | ICD-10-CM | POA: Diagnosis not present

## 2017-01-27 DIAGNOSIS — M170 Bilateral primary osteoarthritis of knee: Secondary | ICD-10-CM | POA: Diagnosis not present

## 2017-01-27 DIAGNOSIS — M25562 Pain in left knee: Secondary | ICD-10-CM | POA: Diagnosis not present

## 2017-01-27 DIAGNOSIS — R262 Difficulty in walking, not elsewhere classified: Secondary | ICD-10-CM | POA: Diagnosis not present

## 2017-02-10 DIAGNOSIS — R262 Difficulty in walking, not elsewhere classified: Secondary | ICD-10-CM | POA: Diagnosis not present

## 2017-02-10 DIAGNOSIS — M1711 Unilateral primary osteoarthritis, right knee: Secondary | ICD-10-CM | POA: Diagnosis not present

## 2017-02-10 DIAGNOSIS — M25561 Pain in right knee: Secondary | ICD-10-CM | POA: Diagnosis not present

## 2017-02-12 ENCOUNTER — Encounter: Payer: Self-pay | Admitting: Gastroenterology

## 2017-02-18 ENCOUNTER — Telehealth: Payer: Self-pay | Admitting: Hematology

## 2017-02-18 NOTE — Telephone Encounter (Signed)
Received a call from Troy at [facility] to schedule the pt an appt. Appt has been scheduled for the pt to see Dr. Candise Che on 4/30 at 3pm. Per Ms. Shirley pt needed appt around 3pm.

## 2017-03-16 ENCOUNTER — Ambulatory Visit (HOSPITAL_BASED_OUTPATIENT_CLINIC_OR_DEPARTMENT_OTHER): Payer: Medicare HMO | Admitting: Hematology

## 2017-03-16 ENCOUNTER — Telehealth: Payer: Self-pay | Admitting: Hematology

## 2017-03-16 ENCOUNTER — Encounter: Payer: Self-pay | Admitting: Hematology

## 2017-03-16 ENCOUNTER — Ambulatory Visit (HOSPITAL_BASED_OUTPATIENT_CLINIC_OR_DEPARTMENT_OTHER): Payer: Medicare HMO

## 2017-03-16 VITALS — BP 165/86 | HR 96 | Temp 98.7°F | Resp 18 | Wt 200.2 lb

## 2017-03-16 DIAGNOSIS — D751 Secondary polycythemia: Secondary | ICD-10-CM

## 2017-03-16 DIAGNOSIS — I10 Essential (primary) hypertension: Secondary | ICD-10-CM

## 2017-03-16 DIAGNOSIS — M818 Other osteoporosis without current pathological fracture: Secondary | ICD-10-CM

## 2017-03-16 LAB — CBC & DIFF AND RETIC
BASO%: 0.3 % (ref 0.0–2.0)
BASOS ABS: 0 10*3/uL (ref 0.0–0.1)
EOS ABS: 0.3 10*3/uL (ref 0.0–0.5)
EOS%: 2.6 % (ref 0.0–7.0)
HEMATOCRIT: 49.9 % (ref 38.4–49.9)
HEMOGLOBIN: 17.9 g/dL — AB (ref 13.0–17.1)
Immature Retic Fract: 5.6 % (ref 3.00–10.60)
LYMPH%: 24.5 % (ref 14.0–49.0)
MCH: 30.9 pg (ref 27.2–33.4)
MCHC: 35.9 g/dL (ref 32.0–36.0)
MCV: 86.2 fL (ref 79.3–98.0)
MONO#: 1.2 10*3/uL — ABNORMAL HIGH (ref 0.1–0.9)
MONO%: 10 % (ref 0.0–14.0)
NEUT#: 7.5 10*3/uL — ABNORMAL HIGH (ref 1.5–6.5)
NEUT%: 62.6 % (ref 39.0–75.0)
Platelets: 185 10*3/uL (ref 140–400)
RBC: 5.79 10*6/uL (ref 4.20–5.82)
RDW: 12.9 % (ref 11.0–14.6)
RETIC %: 1.89 % — AB (ref 0.80–1.80)
Retic Ct Abs: 109.43 10*3/uL — ABNORMAL HIGH (ref 34.80–93.90)
WBC: 11.9 10*3/uL — ABNORMAL HIGH (ref 4.0–10.3)
lymph#: 2.9 10*3/uL (ref 0.9–3.3)

## 2017-03-16 LAB — COMPREHENSIVE METABOLIC PANEL
ALBUMIN: 4.1 g/dL (ref 3.5–5.0)
ALK PHOS: 73 U/L (ref 40–150)
ALT: 22 U/L (ref 0–55)
AST: 23 U/L (ref 5–34)
Anion Gap: 12 mEq/L — ABNORMAL HIGH (ref 3–11)
BILIRUBIN TOTAL: 0.47 mg/dL (ref 0.20–1.20)
BUN: 20.8 mg/dL (ref 7.0–26.0)
CALCIUM: 10.1 mg/dL (ref 8.4–10.4)
CO2: 27 mEq/L (ref 22–29)
Chloride: 100 mEq/L (ref 98–109)
Creatinine: 1.6 mg/dL — ABNORMAL HIGH (ref 0.7–1.3)
EGFR: 45 mL/min/{1.73_m2} — ABNORMAL LOW (ref >90)
Glucose: 88 mg/dl (ref 70–140)
POTASSIUM: 4.4 meq/L (ref 3.5–5.1)
Sodium: 138 mEq/L (ref 136–145)
Total Protein: 7.7 g/dL (ref 6.4–8.3)

## 2017-03-16 NOTE — Telephone Encounter (Signed)
Gave patient AVS and calender per 4/30 los.  
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
## 2017-03-17 LAB — ERYTHROPOIETIN: ERYTHROPOIETIN: 10.4 m[IU]/mL (ref 2.6–18.5)

## 2017-03-30 ENCOUNTER — Ambulatory Visit: Payer: Medicare HMO | Admitting: Hematology

## 2017-03-30 ENCOUNTER — Telehealth: Payer: Self-pay | Admitting: Hematology

## 2017-03-30 NOTE — Telephone Encounter (Signed)
R.s patients appt per patient - appt was 2:40 pms and had too long of a wait for MD visit , was told that he should r/s .

## 2017-04-02 ENCOUNTER — Encounter: Payer: Self-pay | Admitting: Hematology

## 2017-04-02 ENCOUNTER — Ambulatory Visit (HOSPITAL_BASED_OUTPATIENT_CLINIC_OR_DEPARTMENT_OTHER): Payer: Medicare HMO | Admitting: Hematology

## 2017-04-02 VITALS — BP 145/72 | HR 85 | Temp 98.1°F | Resp 18 | Ht 69.0 in | Wt 199.3 lb

## 2017-04-02 DIAGNOSIS — I10 Essential (primary) hypertension: Secondary | ICD-10-CM

## 2017-04-02 DIAGNOSIS — D751 Secondary polycythemia: Secondary | ICD-10-CM | POA: Diagnosis not present

## 2017-04-02 NOTE — Patient Instructions (Signed)
Thank you for choosing [facility] to provide your oncology and hematology care.  To afford each patient quality time with our providers, please arrive 30 minutes before your scheduled appointment time.  If you arrive late for your appointment, you may be asked to reschedule.  We strive to give you quality time with our providers, and arriving late affects you and other patients whose appointments are after yours.  If you are a no show for multiple scheduled visits, you may be dismissed from the clinic at the providers discretion.   Again, thank you for choosing [facility], our hope is that these requests will decrease the amount of time that you wait before being seen by our physicians.  ______________________________________________________________________ Should you have questions after your visit to the [facility], please contact our office at [phone] between the hours of 8:30 and 4:30 p.m.    Voicemails left after 4:30p.m will not be returned until the following business day.   For prescription refill requests, please have your pharmacy contact us directly.  Please also try to allow 48 hours for prescription requests.   Please contact the scheduling department for questions regarding scheduling.  For scheduling of procedures such as PET scans, CT scans, MRI, Ultrasound, etc please contact central scheduling at [phone].   Resources For Cancer Patients and Caregivers:  American Cancer Society:  800-227-2345  Can help patients locate various types of support and financial assistance Cancer Care: 1-800-813-HOPE (4673) Provides financial assistance, online support groups, medication/co-pay assistance.   [place] DSS:  336-641-3447 Where to apply for food stamps, Medicaid, and utility assistance Medicare Rights Center: 800-333-4114 Helps people with Medicare understand their rights and benefits, navigate the Medicare system, and secure the  quality healthcare they deserve SCAT: 336-333-6589 [place] [employer]'s shared-ride transportation service for eligible riders who have a disability that prevents them from riding the fixed route bus.   For additional information on assistance programs please contact our social worker:   Grier Hock/Abigail Elmore:  [phone] 
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
## 2017-04-03 ENCOUNTER — Telehealth: Payer: Self-pay | Admitting: Hematology

## 2017-04-03 NOTE — Telephone Encounter (Signed)
NO LOS 5/17 

## 2017-06-12 DIAGNOSIS — K648 Other hemorrhoids: Secondary | ICD-10-CM | POA: Diagnosis not present

## 2017-06-12 DIAGNOSIS — I10 Essential (primary) hypertension: Secondary | ICD-10-CM | POA: Diagnosis not present

## 2017-06-12 DIAGNOSIS — N183 Chronic kidney disease, stage 3 (moderate): Secondary | ICD-10-CM | POA: Diagnosis not present

## 2017-06-12 DIAGNOSIS — F419 Anxiety disorder, unspecified: Secondary | ICD-10-CM | POA: Diagnosis not present

## 2017-06-12 DIAGNOSIS — M5134 Other intervertebral disc degeneration, thoracic region: Secondary | ICD-10-CM | POA: Diagnosis not present

## 2017-06-12 DIAGNOSIS — D751 Secondary polycythemia: Secondary | ICD-10-CM | POA: Diagnosis not present

## 2017-06-12 DIAGNOSIS — Z1159 Encounter for screening for other viral diseases: Secondary | ICD-10-CM | POA: Diagnosis not present

## 2017-06-12 DIAGNOSIS — M85851 Other specified disorders of bone density and structure, right thigh: Secondary | ICD-10-CM | POA: Diagnosis not present

## 2017-06-12 DIAGNOSIS — W57XXXA Bitten or stung by nonvenomous insect and other nonvenomous arthropods, initial encounter: Secondary | ICD-10-CM | POA: Diagnosis not present

## 2017-06-12 DIAGNOSIS — E782 Mixed hyperlipidemia: Secondary | ICD-10-CM | POA: Diagnosis not present

## 2017-06-12 DIAGNOSIS — S80869A Insect bite (nonvenomous), unspecified lower leg, initial encounter: Secondary | ICD-10-CM | POA: Diagnosis not present

## 2017-07-19 ENCOUNTER — Emergency Department (HOSPITAL_COMMUNITY): Payer: Medicare HMO

## 2017-07-19 ENCOUNTER — Encounter (HOSPITAL_COMMUNITY): Payer: Self-pay

## 2017-07-19 ENCOUNTER — Emergency Department (HOSPITAL_COMMUNITY)
Admission: EM | Admit: 2017-07-19 | Discharge: 2017-07-19 | Disposition: A | Discharge status: Home / Self Care | Payer: Medicare HMO | Attending: Emergency Medicine | Admitting: Emergency Medicine

## 2017-07-19 DIAGNOSIS — Z79899 Other long term (current) drug therapy: Secondary | ICD-10-CM | POA: Insufficient documentation

## 2017-07-19 DIAGNOSIS — Z7982 Long term (current) use of aspirin: Secondary | ICD-10-CM | POA: Insufficient documentation

## 2017-07-19 DIAGNOSIS — Y9389 Activity, other specified: Secondary | ICD-10-CM | POA: Insufficient documentation

## 2017-07-19 DIAGNOSIS — W2209XA Striking against other stationary object, initial encounter: Secondary | ICD-10-CM | POA: Insufficient documentation

## 2017-07-19 DIAGNOSIS — S9031XA Contusion of right foot, initial encounter: Secondary | ICD-10-CM | POA: Diagnosis not present

## 2017-07-19 DIAGNOSIS — Z87891 Personal history of nicotine dependence: Secondary | ICD-10-CM | POA: Insufficient documentation

## 2017-07-19 DIAGNOSIS — M25561 Pain in right knee: Secondary | ICD-10-CM | POA: Diagnosis not present

## 2017-07-19 DIAGNOSIS — M25571 Pain in right ankle and joints of right foot: Secondary | ICD-10-CM | POA: Diagnosis not present

## 2017-07-19 DIAGNOSIS — I10 Essential (primary) hypertension: Secondary | ICD-10-CM | POA: Diagnosis not present

## 2017-07-19 DIAGNOSIS — Y999 Unspecified external cause status: Secondary | ICD-10-CM | POA: Diagnosis not present

## 2017-07-19 DIAGNOSIS — W19XXXA Unspecified fall, initial encounter: Secondary | ICD-10-CM

## 2017-07-19 DIAGNOSIS — Y92003 Bedroom of unspecified non-institutional (private) residence as the place of occurrence of the external cause: Secondary | ICD-10-CM | POA: Insufficient documentation

## 2017-07-19 MED ORDER — OXYCODONE-ACETAMINOPHEN 5-325 MG PO TABS: 1.0000 | ORAL_TABLET | Freq: Three times a day (TID) | ORAL | 0 refills | Status: AC | PRN

## 2017-07-19 NOTE — Progress Notes (Signed)
Orthopedic Tech Progress Note Patient Details:  Robert Davidson 03/04/1951 [id number]  Ortho Devices Type of Ortho Device: Postop shoe/boot Ortho Device/Splint Location: RLE Ortho Device/Splint Interventions: Ordered, Application   Jennye MoccasinHughes, Robert Bortle Craig 07/19/2017, 3:09 PM

## 2017-07-19 NOTE — ED Triage Notes (Signed)
Patient complains of ongoing right knee and foot pain after tripping Friday and hitting right leg on wooden bed post. bruising and swelling noted to foot. Pain with any ambulation.

## 2017-07-19 NOTE — Progress Notes (Signed)
Orthopedic Tech Progress Note Patient Details:  Lew DawesGary E Poirier 12/14/1950 [id number] Charges deleted due to change in orders. Patient ID: Lew DawesGary E Foulks, male   DOB: 01/07/1951, 66 y.o.   MRN: [id number]   Jennye MoccasinHughes, Aleeza [place] Craig 07/19/2017, 3:36 PM

## 2017-07-19 NOTE — Discharge Instructions (Signed)
You may take 650 mg of Tylenol every 6 hours or 1000 mg every 8 hours for pain. You may also take 1 tablet of Percocet every 8 hours as needed for severe pain that does not improve after taking Tylenol.  Please keep your foot elevated when you are resting. Apply ice 3-4 times per day for at least 15-20 minutes. You can start performing exercises of your ankle to avoid stiffness as your pain allows. You may use crutches until your pain improves and you can bear weight on the boot we provided you today.  Please call Dr. Shon BatonBrooks , the orthopaedic surgeon, to schedule a follow-up appointment from today's visit.   If you develop new or worsening symptoms, including fever, red streaking up the leg, or if you have a new fall or injury, please return to the emergency department for re-evaluation.

## 2017-07-19 NOTE — Progress Notes (Signed)
Orthopedic Tech Progress Note Patient Details:  Robert Davidson 08/28/1951 [id number]  Ortho Devices Type of Ortho Device: Ace wrap, Post (short leg) splint, Stirrup splint Ortho Device/Splint Location: rle Ortho Device/Splint Interventions: Application   [name] 07/19/2017, 3:46 PM

## 2017-07-19 NOTE — ED Provider Notes (Signed)
[facility]-EMERGENCY DEPT Provider Note   CSN: [id number] Arrival date & time: 07/19/17  1150     History   Chief Complaint No chief complaint on file.   HPI Robert Davidson is a 66 y.o. male who presents to the emergency department with a chief complaint of injury to the right foot and ankle 2 nights ago. The patient reports he frequently gets a Charley horse in the middle of the night while he is sleeping so he jumped out of bed and hit the arch of his right foot on a piece of the wood bed frame that sticks out. He reports the foot and ankle began to swell immediately, but he did not present for evaluation until today after the insistence of his wife. He reports that he has not been able to bear weight on the extremity and is concerned that it might be broken. He reports that he has elevated the foot and applied ice at home with minimal improvement. No other complaints at this time. He reports previous injury to the right knee, which he also states that he injured in the fall. No other complaints at this time, including left foot, ankle, or knee pain. He denies hitting his head, LOC, nausea, or emesis.  The history is provided by the patient. No language interpreter was used.    Past Medical History:  Diagnosis Date  . Anxiety   . Esophageal reflux   . HTN (hypertension)   . HTN (hypertension)   . Hyperlipidemia   . Hypogonadism male   . Hypogonadism male   . MRSA infection   . Osteopenia   . Osteoporosis     Patient Active Problem List   Diagnosis Date Noted  . ARF (acute renal failure) (HCC) 08/08/2013  . MRSA infection 02/02/2012  . HTN (hypertension)   . Hypogonadism male   . Osteoporosis   . Depression 01/30/2012  . Chronic back pain 01/30/2012  . HBP (high blood pressure) 01/30/2012    Past Surgical History:  Procedure Laterality Date  . herniated disc T5         Home Medications    Prior to Admission medications   Medication Sig Start Date End Date Taking?  Authorizing Provider  aspirin 81 MG tablet Take 81 mg by mouth daily.    [provider]  atorvastatin (LIPITOR) 10 MG tablet Take 10 mg by mouth daily.  07/28/13   [provider]  Calcium Carbonate-Vitamin D (CALCIUM 600 + D PO) Take 1 tablet by mouth 2 (two) times daily.    [provider]  diazepam (VALIUM) 5 MG tablet Take 5 mg by mouth every 6 (six) hours as needed. For anxiety    [provider]  Fish Oil-Cholecalciferol (FISH OIL + D3) 1000-1000 MG-UNIT CAPS Take by mouth.    [provider]  lisinopril (PRINIVIL,ZESTRIL) 10 MG tablet  08/17/16   [provider]  Multiple Vitamin (MULTIVITAMIN) capsule Take 1 capsule by mouth daily.    [provider]  oxyCODONE-acetaminophen (PERCOCET/ROXICET) 5-325 MG tablet Take 1 tablet by mouth every 8 (eight) hours as needed for severe pain. 07/19/17   Rossana Molchan A, PA-C  polyethylene glycol (MIRALAX / GLYCOLAX) packet Take 17 g by mouth daily.    [provider]  Venlafaxine HCl 150 MG TB24 Take 150 mg by mouth daily.     [provider]    Family History Family History  Problem Relation Age of Onset  . Heart failure Father   .  CVA Father     Social History Social History  Substance Use Topics  . Smoking status: Former Games developer  . Smokeless tobacco: Never Used  . Alcohol use No     Allergies   Rifampin; Sulfa drugs cross reactors; Viagra [sildenafil citrate]; and Ibuprofen   Review of Systems Review of Systems  Constitutional: Negative for chills and fever.  Musculoskeletal: Positive for arthralgias, gait problem, joint swelling and myalgias.  Skin: Positive for color change and wound.  Neurological: Negative for weakness and numbness.     Physical Exam Updated Vital Signs BP (!) 122/97 (BP Location: Right Arm)   Pulse 91   Temp 98.3 F (36.8 C) (Oral)   Resp 16   SpO2 98%   Physical Exam  Constitutional: He appears well-developed.  HENT:    Head: Normocephalic.  Eyes: Conjunctivae are normal.  Neck: Neck supple.  Cardiovascular: Normal rate and regular rhythm.   No murmur heard. Pulmonary/Chest: Effort normal.  Abdominal: Soft. He exhibits no distension.  Musculoskeletal: He exhibits edema.  Diffuse edema and warmth extending from the right ankle to the toes. Ecchymosis noted at the base of the right toes. 2+ DP and PT pulses. Sensation is intact except on the sole of the foot, which is baseline for the patient's neuropathy. Decreased strength against resistance secondary to pain and swelling.  Neurological: He is alert.  Skin: Skin is warm and dry.  Psychiatric: His behavior is normal.  Nursing note and vitals reviewed.    ED Treatments / Results  Labs (all labs ordered are listed, but only abnormal results are displayed) Labs Reviewed - No data to display  EKG  EKG Interpretation None       Radiology Dg Ankle Complete Right  Result Date: 07/19/2017 CLINICAL DATA:  Recent fall.  Right ankle pain and swelling EXAM: RIGHT ANKLE - COMPLETE 3+ VIEW COMPARISON:  None. FINDINGS: Diffuse right ankle soft tissue swelling. No fracture or subluxation. No suspicious focal osseous lesion. Mild osteoarthritis in the anterior right tibiotalar joint. No radiopaque foreign body. IMPRESSION: 1. Diffuse right ankle soft tissue swelling, with no fracture or subluxation. 2. Mild anterior right ankle joint osteoarthritis. Electronically Signed   By: Delbert Phenix M.D.   On: 07/19/2017 13:14   Dg Knee Complete 4 Views Right  Result Date: 07/19/2017 CLINICAL DATA:  Fall 2 days prior.  Right knee pain. EXAM: RIGHT KNEE - COMPLETE 4+ VIEW COMPARISON:  None. FINDINGS: Small suprapatellar right knee joint effusion. No fracture or malalignment. No suspicious focal osseous lesion. Mild tricompartmental osteoarthritis, most prominent in the medial compartment. No radiopaque foreign body. IMPRESSION: 1. Small suprapatellar right knee joint  effusion, with no fracture or malalignment. 2. Mild tricompartmental right knee osteoarthritis. Electronically Signed   By: Delbert Phenix M.D.   On: 07/19/2017 13:12   Dg Foot Complete Right  Result Date: 07/19/2017 CLINICAL DATA:  Pt states he fell out of bed 2 days ago, gross rt foot and ankle sts with bruising across the toes and calcaneous, pains across patella, cannot bear weight, no knee sts, past surgeries EXAM: RIGHT FOOT COMPLETE - 3+ VIEW COMPARISON:  None. FINDINGS: No fracture or dislocation of mid foot or forefoot. The phalanges are normal. The calcaneus is normal. No soft tissue abnormality. IMPRESSION: No fracture or dislocation. Electronically Signed   By: Genevive Bi M.D.   On: 07/19/2017 13:13    Procedures Procedures (including critical care time)  Medications Ordered in ED Medications - No data to display  Initial Impression / Assessment and Plan / ED Course  I have reviewed the triage vital signs and the nursing notes.  Pertinent labs & imaging results that were available during my care of the patient were reviewed by me and considered in my medical decision making (see chart for details).     Patient X-Ray negative for obvious fracture or dislocation. There is diffuse swelling throughout the foot and ankle, and the area is mildly warm to the touch. At this time, I think the warmth is secondary to the inflammation. The area is thoroughly examined for puncture wounds since the patient has neuropathy at baseline on the sole of the right foot. The skin is intact throughout. Discussed and evaluated the patient with Dr. Fredderick PhenixBelfi, attending physician, who is agreeable with the management plan at this time. Pain managed in ED. Pt advised to follow up with orthopedics if symptoms persist for possibility of missed fracture diagnosis. Patient given splint while in ED, conservative therapy recommended and discussed. Strict return precautions given. Patient will be dc home & is  agreeable with above plan.  Final Clinical Impressions(s) / ED Diagnoses   Final diagnoses:  Fall, initial encounter  Acute pain of right knee  Acute right ankle pain    New Prescriptions Discharge Medication List as of 07/19/2017  2:55 PM    START taking these medications   Details  oxyCODONE-acetaminophen (PERCOCET/ROXICET) 5-325 MG tablet Take 1 tablet by mouth every 8 (eight) hours as needed for severe pain., Starting Sun 07/19/2017, Print         Garett Tetzloff A, PA-C 07/19/17 1728    Rolan BuccoBelfi, Melanie, MD 07/19/17 [phone]

## 2017-07-23 DIAGNOSIS — M79674 Pain in right toe(s): Secondary | ICD-10-CM | POA: Diagnosis not present

## 2017-07-27 DIAGNOSIS — S92211A Displaced fracture of cuboid bone of right foot, initial encounter for closed fracture: Secondary | ICD-10-CM | POA: Diagnosis not present

## 2017-07-27 DIAGNOSIS — S92241A Displaced fracture of medial cuneiform of right foot, initial encounter for closed fracture: Secondary | ICD-10-CM | POA: Diagnosis not present

## 2017-07-27 DIAGNOSIS — S92511A Displaced fracture of proximal phalanx of right lesser toe(s), initial encounter for closed fracture: Secondary | ICD-10-CM | POA: Diagnosis not present

## 2017-07-27 DIAGNOSIS — S92221A Displaced fracture of lateral cuneiform of right foot, initial encounter for closed fracture: Secondary | ICD-10-CM | POA: Diagnosis not present

## 2017-07-27 DIAGNOSIS — S92321A Displaced fracture of second metatarsal bone, right foot, initial encounter for closed fracture: Secondary | ICD-10-CM | POA: Diagnosis not present

## 2017-07-27 DIAGNOSIS — S92341A Displaced fracture of fourth metatarsal bone, right foot, initial encounter for closed fracture: Secondary | ICD-10-CM | POA: Diagnosis not present

## 2017-07-27 DIAGNOSIS — S92311A Displaced fracture of first metatarsal bone, right foot, initial encounter for closed fracture: Secondary | ICD-10-CM | POA: Diagnosis not present

## 2017-07-27 DIAGNOSIS — S92331A Displaced fracture of third metatarsal bone, right foot, initial encounter for closed fracture: Secondary | ICD-10-CM | POA: Diagnosis not present

## 2017-07-27 DIAGNOSIS — S92414A Nondisplaced fracture of proximal phalanx of right great toe, initial encounter for closed fracture: Secondary | ICD-10-CM | POA: Diagnosis not present

## 2017-07-28 DIAGNOSIS — M79674 Pain in right toe(s): Secondary | ICD-10-CM | POA: Diagnosis not present

## 2017-08-05 DIAGNOSIS — S93621A Sprain of tarsometatarsal ligament of right foot, initial encounter: Secondary | ICD-10-CM | POA: Diagnosis not present

## 2017-09-09 DIAGNOSIS — S93621D Sprain of tarsometatarsal ligament of right foot, subsequent encounter: Secondary | ICD-10-CM | POA: Diagnosis not present

## 2017-09-30 DIAGNOSIS — S93621D Sprain of tarsometatarsal ligament of right foot, subsequent encounter: Secondary | ICD-10-CM | POA: Diagnosis not present

## 2017-10-29 IMAGING — DX DG ANKLE COMPLETE 3+V*R*
3 series · 3 of 3 positions shown · non-contrast
Comparison: None.

CLINICAL DATA: Recent fall.  Right ankle pain and swelling

EXAM:
RIGHT ANKLE - COMPLETE 3+ VIEW

[ankle ap]
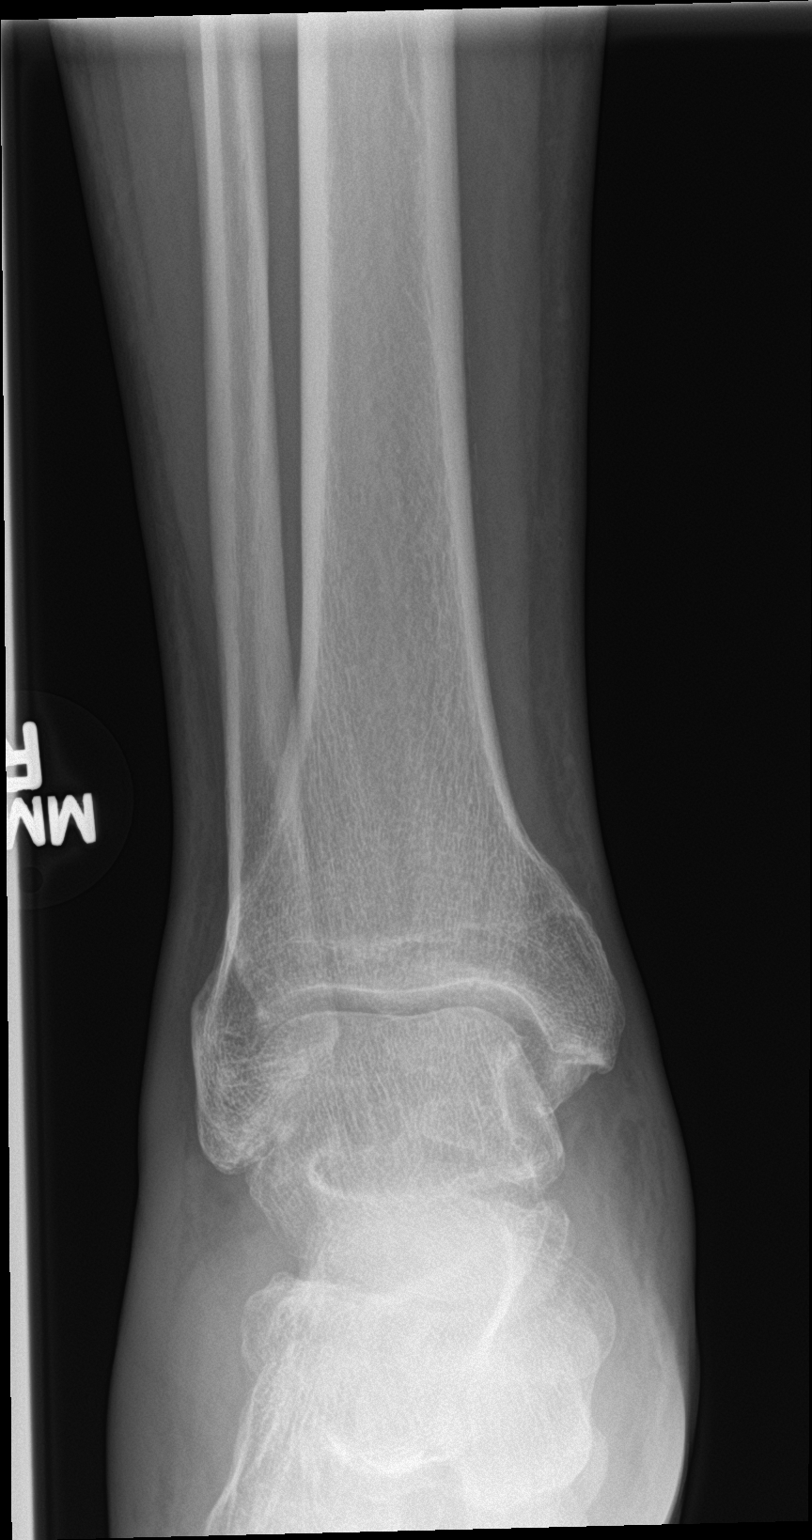

[ankle obl]
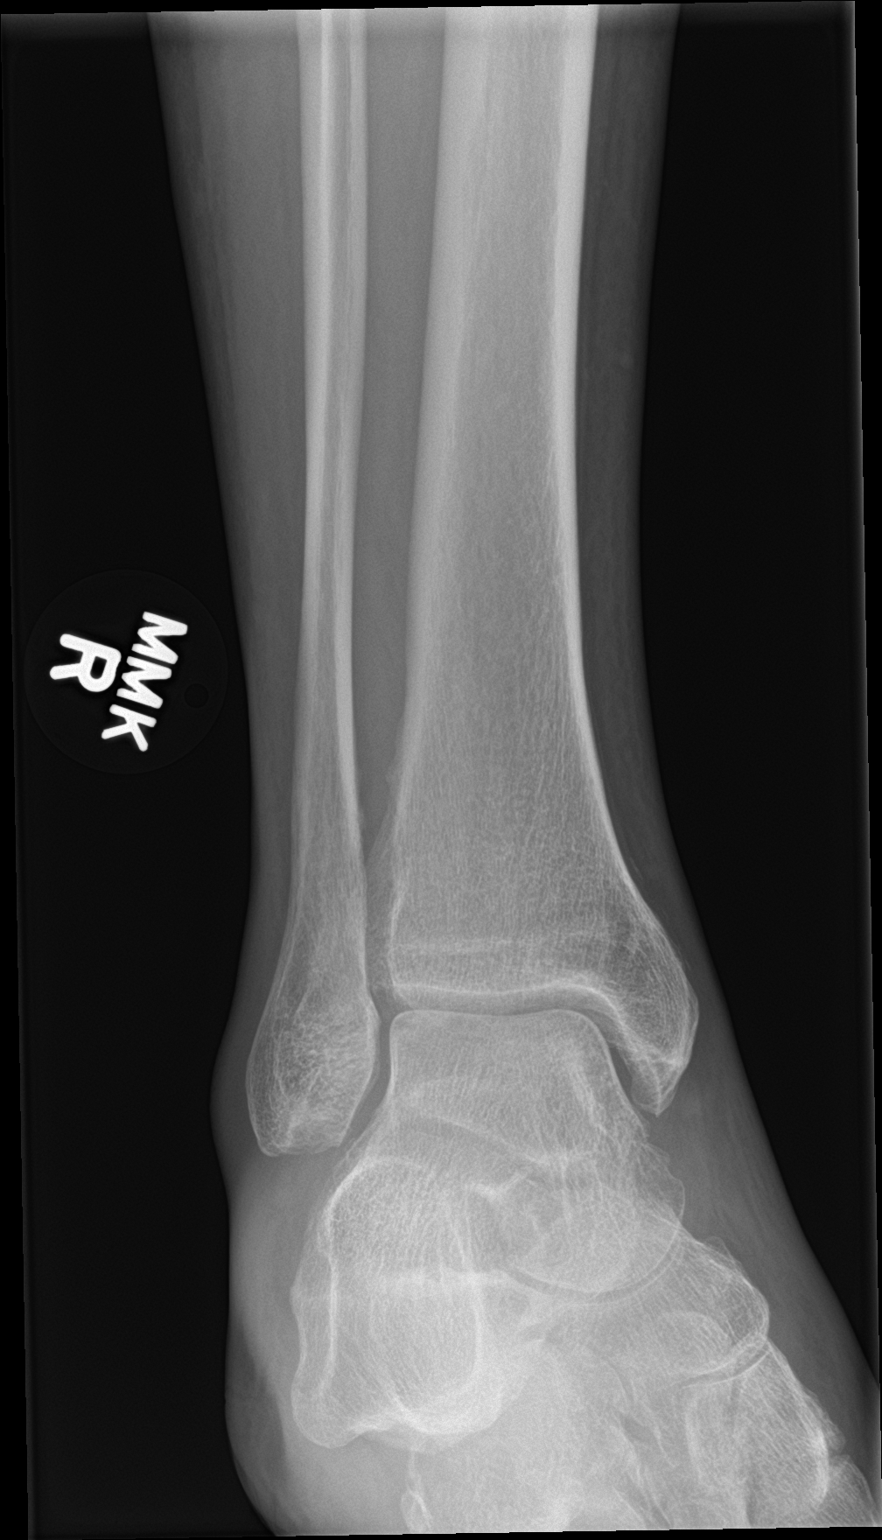

[ankle lat]
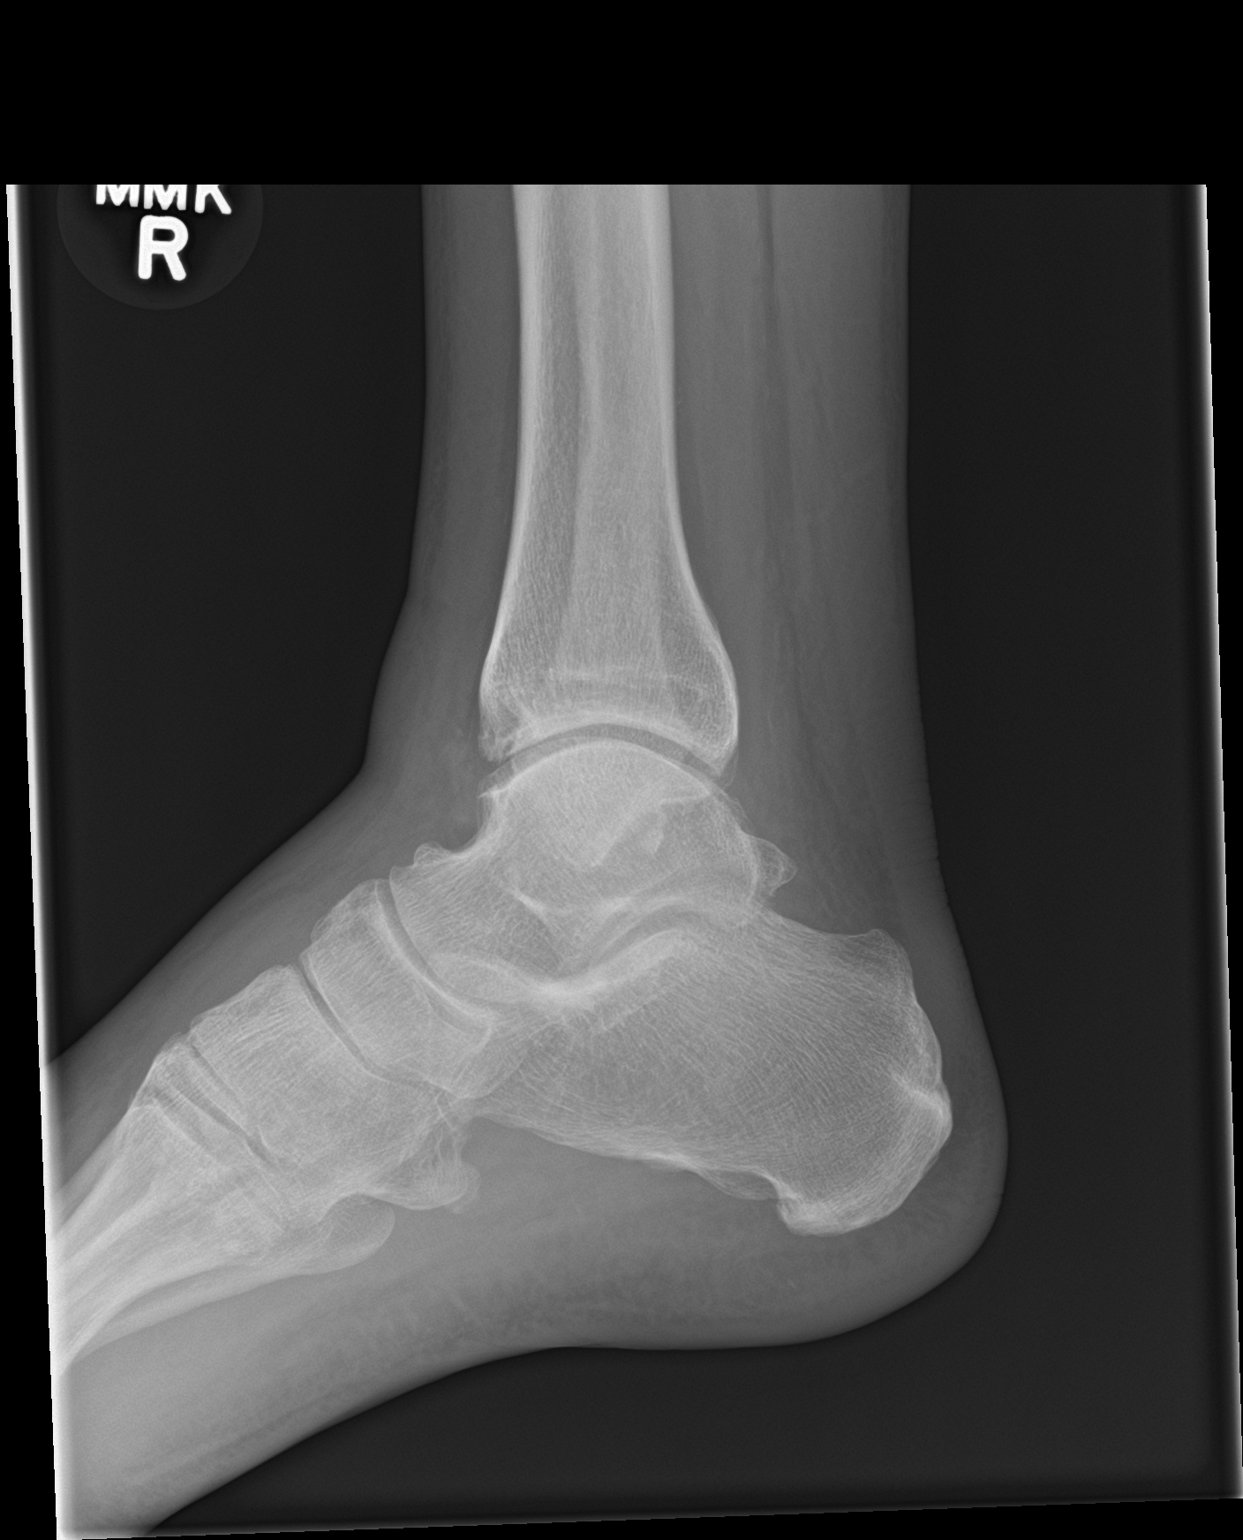

[3 of 3 positions shown; findings below may reference images not displayed]

FINDINGS: Diffuse right ankle soft tissue swelling. No fracture or
subluxation. No suspicious focal osseous lesion. Mild osteoarthritis
in the anterior right tibiotalar joint. No radiopaque foreign body.
IMPRESSION: 1. Diffuse right ankle soft tissue swelling, with no fracture or
subluxation.
2. Mild anterior right ankle joint osteoarthritis.

## 2017-10-29 IMAGING — DX DG FOOT COMPLETE 3+V*R*
3 series · 3 of 3 positions shown · non-contrast
Comparison: None.

ADDENDUM:
Upon further review, there is a subtle ossific density projecting
between the base of the second and third metatarsals on the oblique
projection. Potential irregularities at the bases of the third and
fourth metatarsals.

There is a subtle cortical offset at the medial base of the fifth
proximal phalanx.
There is soft tissue swelling of the dorsum of the forefoot.
Patient was seen in consultation and received treatment at [REDACTED] which identified concern for fracture.
CLINICAL DATA: Pt states he fell out of bed 2 days ago, gross rt
foot and ankle Shariq with bruising across the toes and calcaneous,
pains across patella, cannot bear weight, no knee Farheen, past
surgeries
EXAM:
RIGHT FOOT COMPLETE - 3+ VIEW

[foot ap]
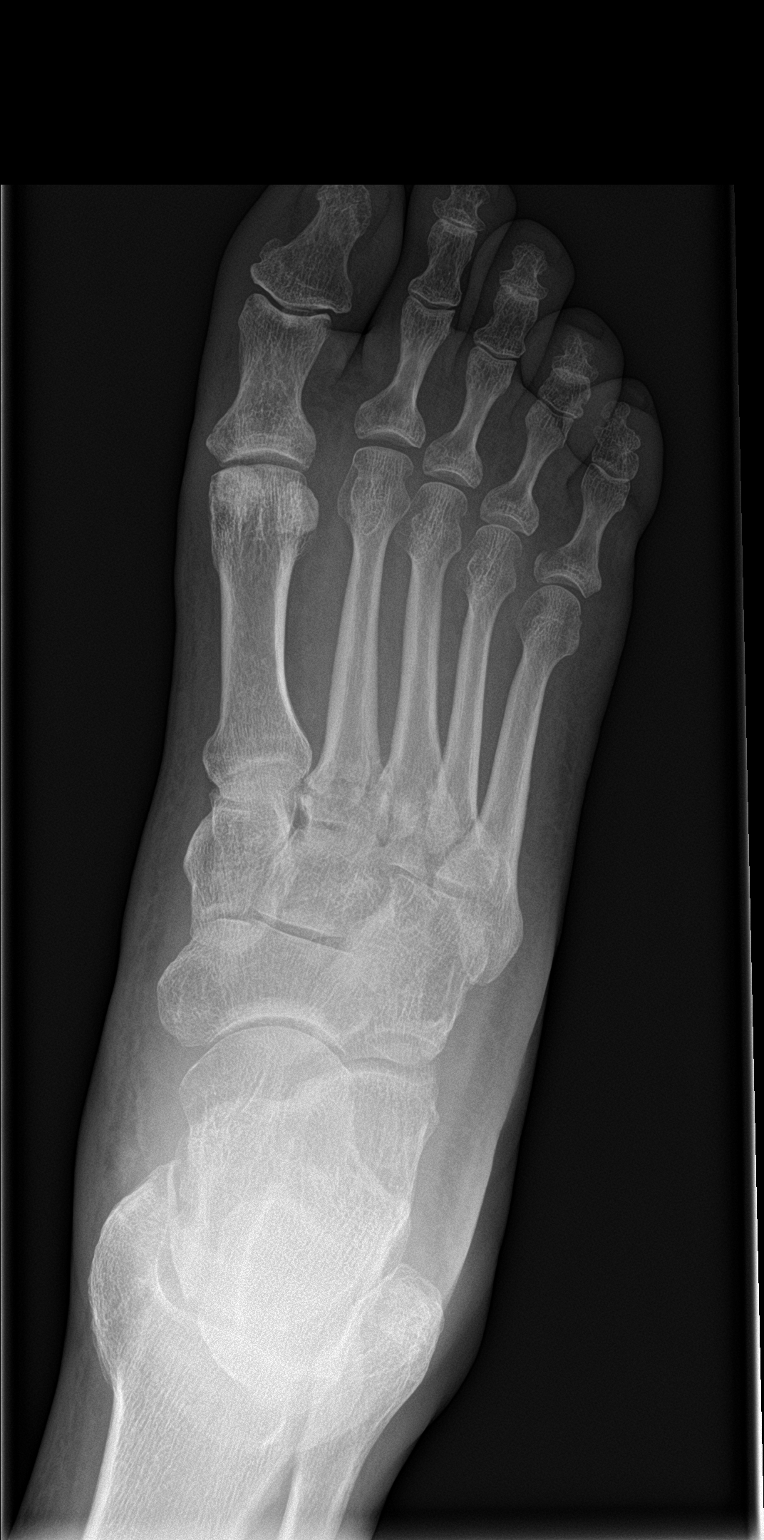

[foot obl]
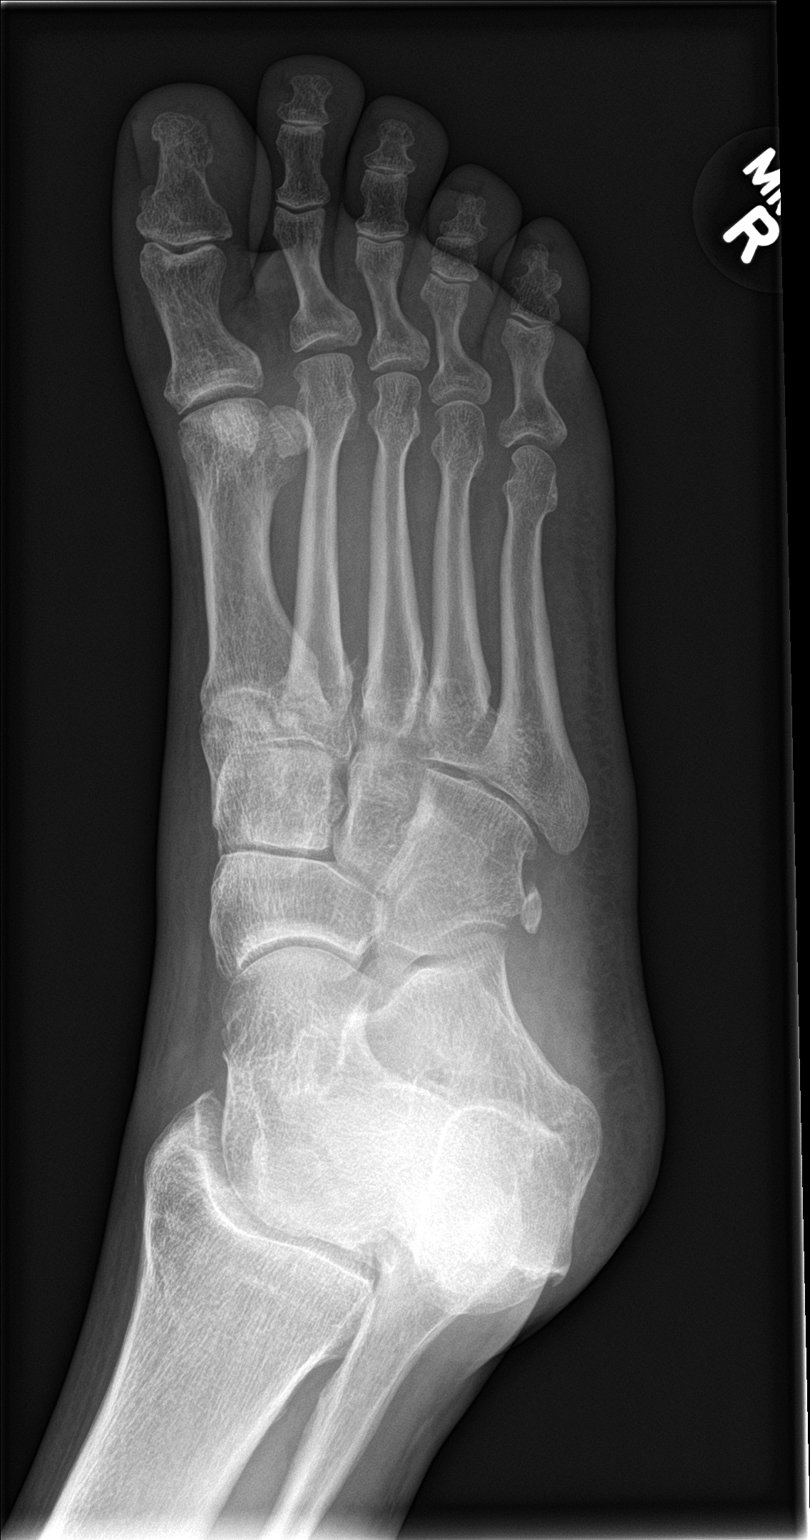

[foot lat]
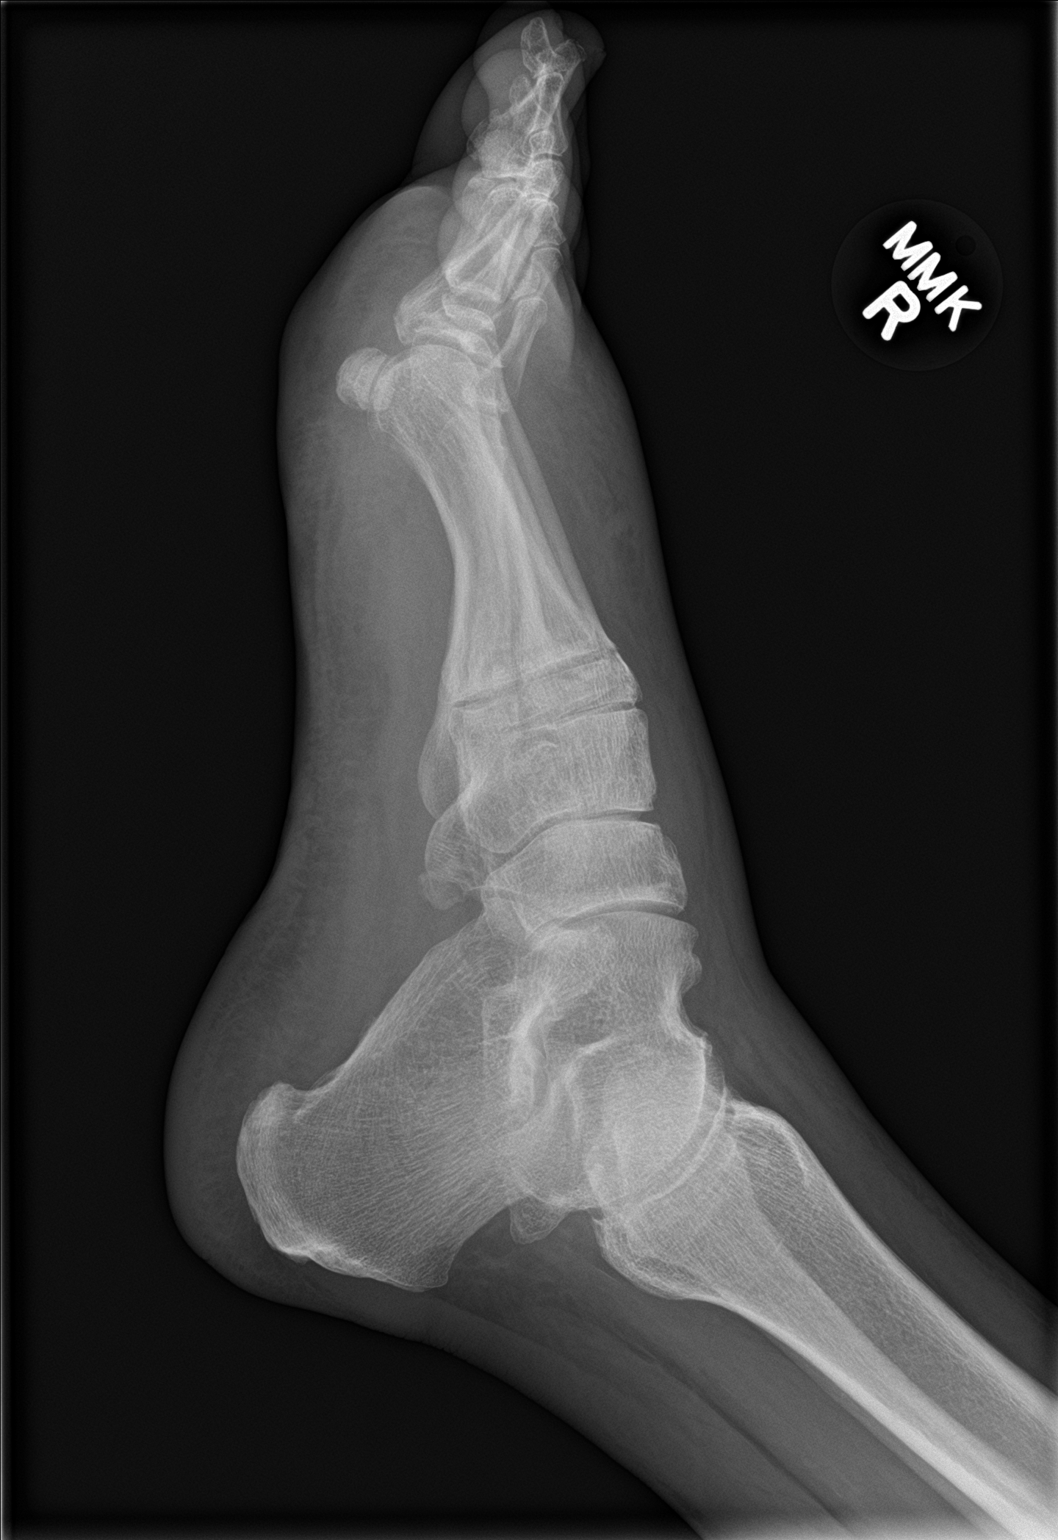

[3 of 3 positions shown; findings below may reference images not displayed]

FINDINGS: No fracture or dislocation of mid foot or forefoot. The phalanges
are normal. The calcaneus is normal. No soft tissue abnormality.
IMPRESSION: No fracture or dislocation.

## 2018-01-24 DIAGNOSIS — J209 Acute bronchitis, unspecified: Secondary | ICD-10-CM | POA: Diagnosis not present

## 2018-01-24 DIAGNOSIS — J111 Influenza due to unidentified influenza virus with other respiratory manifestations: Secondary | ICD-10-CM | POA: Diagnosis not present

## 2018-01-24 DIAGNOSIS — J0100 Acute maxillary sinusitis, unspecified: Secondary | ICD-10-CM | POA: Diagnosis not present

## 2018-02-22 DIAGNOSIS — Z1211 Encounter for screening for malignant neoplasm of colon: Secondary | ICD-10-CM | POA: Diagnosis not present

## 2018-02-22 DIAGNOSIS — D582 Other hemoglobinopathies: Secondary | ICD-10-CM | POA: Diagnosis not present

## 2018-02-22 DIAGNOSIS — M85851 Other specified disorders of bone density and structure, right thigh: Secondary | ICD-10-CM | POA: Diagnosis not present

## 2018-02-22 DIAGNOSIS — I10 Essential (primary) hypertension: Secondary | ICD-10-CM | POA: Diagnosis not present

## 2018-02-22 DIAGNOSIS — F419 Anxiety disorder, unspecified: Secondary | ICD-10-CM | POA: Diagnosis not present

## 2018-02-22 DIAGNOSIS — N529 Male erectile dysfunction, unspecified: Secondary | ICD-10-CM | POA: Diagnosis not present

## 2018-02-22 DIAGNOSIS — E782 Mixed hyperlipidemia: Secondary | ICD-10-CM | POA: Diagnosis not present

## 2018-02-22 DIAGNOSIS — M5134 Other intervertebral disc degeneration, thoracic region: Secondary | ICD-10-CM | POA: Diagnosis not present

## 2018-02-22 DIAGNOSIS — N183 Chronic kidney disease, stage 3 (moderate): Secondary | ICD-10-CM | POA: Diagnosis not present

## 2018-08-26 DIAGNOSIS — H524 Presbyopia: Secondary | ICD-10-CM | POA: Diagnosis not present

## 2018-08-26 DIAGNOSIS — I10 Essential (primary) hypertension: Secondary | ICD-10-CM | POA: Diagnosis not present

## 2019-02-18 DIAGNOSIS — K5900 Constipation, unspecified: Secondary | ICD-10-CM | POA: Diagnosis not present

## 2019-02-18 DIAGNOSIS — F419 Anxiety disorder, unspecified: Secondary | ICD-10-CM | POA: Diagnosis not present

## 2019-02-18 DIAGNOSIS — M85851 Other specified disorders of bone density and structure, right thigh: Secondary | ICD-10-CM | POA: Diagnosis not present

## 2019-02-18 DIAGNOSIS — I10 Essential (primary) hypertension: Secondary | ICD-10-CM | POA: Diagnosis not present

## 2019-02-18 DIAGNOSIS — Z0000 Encounter for general adult medical examination without abnormal findings: Secondary | ICD-10-CM | POA: Diagnosis not present

## 2019-02-18 DIAGNOSIS — M5134 Other intervertebral disc degeneration, thoracic region: Secondary | ICD-10-CM | POA: Diagnosis not present

## 2019-02-18 DIAGNOSIS — E782 Mixed hyperlipidemia: Secondary | ICD-10-CM | POA: Diagnosis not present

## 2019-02-18 DIAGNOSIS — N529 Male erectile dysfunction, unspecified: Secondary | ICD-10-CM | POA: Diagnosis not present

## 2019-02-18 DIAGNOSIS — N183 Chronic kidney disease, stage 3 (moderate): Secondary | ICD-10-CM | POA: Diagnosis not present

## 2020-05-25 DIAGNOSIS — I10 Essential (primary) hypertension: Secondary | ICD-10-CM | POA: Diagnosis not present

## 2020-05-25 DIAGNOSIS — Z882 Allergy status to sulfonamides status: Secondary | ICD-10-CM | POA: Diagnosis not present

## 2020-05-25 DIAGNOSIS — R9431 Abnormal electrocardiogram [ECG] [EKG]: Secondary | ICD-10-CM | POA: Diagnosis not present

## 2020-05-25 DIAGNOSIS — R079 Chest pain, unspecified: Secondary | ICD-10-CM | POA: Diagnosis not present

## 2020-06-04 DIAGNOSIS — Z791 Long term (current) use of non-steroidal anti-inflammatories (NSAID): Secondary | ICD-10-CM | POA: Diagnosis not present

## 2020-06-04 DIAGNOSIS — R0789 Other chest pain: Secondary | ICD-10-CM | POA: Diagnosis not present

## 2020-06-04 DIAGNOSIS — Z1211 Encounter for screening for malignant neoplasm of colon: Secondary | ICD-10-CM | POA: Diagnosis not present

## 2020-06-04 DIAGNOSIS — R142 Eructation: Secondary | ICD-10-CM | POA: Diagnosis not present

## 2020-09-25 DIAGNOSIS — F419 Anxiety disorder, unspecified: Secondary | ICD-10-CM | POA: Diagnosis not present

## 2020-09-25 DIAGNOSIS — K219 Gastro-esophageal reflux disease without esophagitis: Secondary | ICD-10-CM | POA: Diagnosis not present

## 2020-09-25 DIAGNOSIS — I10 Essential (primary) hypertension: Secondary | ICD-10-CM | POA: Diagnosis not present

## 2020-09-25 DIAGNOSIS — E782 Mixed hyperlipidemia: Secondary | ICD-10-CM | POA: Diagnosis not present

## 2020-10-10 DIAGNOSIS — K219 Gastro-esophageal reflux disease without esophagitis: Secondary | ICD-10-CM | POA: Diagnosis not present

## 2020-10-10 DIAGNOSIS — Z1211 Encounter for screening for malignant neoplasm of colon: Secondary | ICD-10-CM | POA: Diagnosis not present

## 2021-02-05 DIAGNOSIS — Z1211 Encounter for screening for malignant neoplasm of colon: Secondary | ICD-10-CM | POA: Diagnosis not present

## 2021-02-05 DIAGNOSIS — K219 Gastro-esophageal reflux disease without esophagitis: Secondary | ICD-10-CM | POA: Diagnosis not present

## 2021-02-05 DIAGNOSIS — R140 Abdominal distension (gaseous): Secondary | ICD-10-CM | POA: Diagnosis not present

## 2021-04-08 DIAGNOSIS — Z1211 Encounter for screening for malignant neoplasm of colon: Secondary | ICD-10-CM | POA: Diagnosis not present

## 2021-04-08 DIAGNOSIS — K219 Gastro-esophageal reflux disease without esophagitis: Secondary | ICD-10-CM | POA: Diagnosis not present

## 2021-07-09 DIAGNOSIS — K219 Gastro-esophageal reflux disease without esophagitis: Secondary | ICD-10-CM | POA: Diagnosis not present

## 2021-07-09 DIAGNOSIS — Z1211 Encounter for screening for malignant neoplasm of colon: Secondary | ICD-10-CM | POA: Diagnosis not present

## 2021-09-09 DIAGNOSIS — R12 Heartburn: Secondary | ICD-10-CM | POA: Diagnosis not present

## 2021-09-09 DIAGNOSIS — K219 Gastro-esophageal reflux disease without esophagitis: Secondary | ICD-10-CM | POA: Diagnosis not present

## 2021-09-09 DIAGNOSIS — R1013 Epigastric pain: Secondary | ICD-10-CM | POA: Diagnosis not present

## 2021-09-09 DIAGNOSIS — K2289 Other specified disease of esophagus: Secondary | ICD-10-CM | POA: Diagnosis not present

## 2021-09-09 DIAGNOSIS — K2930 Chronic superficial gastritis without bleeding: Secondary | ICD-10-CM | POA: Diagnosis not present

## 2021-09-12 DIAGNOSIS — K219 Gastro-esophageal reflux disease without esophagitis: Secondary | ICD-10-CM | POA: Diagnosis not present

## 2021-09-12 DIAGNOSIS — K2930 Chronic superficial gastritis without bleeding: Secondary | ICD-10-CM | POA: Diagnosis not present

## 2021-12-10 DIAGNOSIS — Z1211 Encounter for screening for malignant neoplasm of colon: Secondary | ICD-10-CM | POA: Diagnosis not present

## 2021-12-10 DIAGNOSIS — K219 Gastro-esophageal reflux disease without esophagitis: Secondary | ICD-10-CM | POA: Diagnosis not present

## 2021-12-10 DIAGNOSIS — Z0100 Encounter for examination of eyes and vision without abnormal findings: Secondary | ICD-10-CM | POA: Diagnosis not present

## 2021-12-10 DIAGNOSIS — I10 Essential (primary) hypertension: Secondary | ICD-10-CM | POA: Diagnosis not present

## 2021-12-10 DIAGNOSIS — H524 Presbyopia: Secondary | ICD-10-CM | POA: Diagnosis not present

## 2021-12-31 ENCOUNTER — Other Ambulatory Visit: Payer: Self-pay | Admitting: Family Medicine

## 2021-12-31 DIAGNOSIS — K219 Gastro-esophageal reflux disease without esophagitis: Secondary | ICD-10-CM | POA: Diagnosis not present

## 2021-12-31 DIAGNOSIS — N1831 Chronic kidney disease, stage 3a: Secondary | ICD-10-CM | POA: Diagnosis not present

## 2021-12-31 DIAGNOSIS — M85851 Other specified disorders of bone density and structure, right thigh: Secondary | ICD-10-CM | POA: Diagnosis not present

## 2021-12-31 DIAGNOSIS — Z0000 Encounter for general adult medical examination without abnormal findings: Secondary | ICD-10-CM | POA: Diagnosis not present

## 2021-12-31 DIAGNOSIS — Z136 Encounter for screening for cardiovascular disorders: Secondary | ICD-10-CM | POA: Diagnosis not present

## 2021-12-31 DIAGNOSIS — E782 Mixed hyperlipidemia: Secondary | ICD-10-CM | POA: Diagnosis not present

## 2021-12-31 DIAGNOSIS — I10 Essential (primary) hypertension: Secondary | ICD-10-CM | POA: Diagnosis not present

## 2021-12-31 DIAGNOSIS — Z1389 Encounter for screening for other disorder: Secondary | ICD-10-CM | POA: Diagnosis not present

## 2021-12-31 DIAGNOSIS — Z125 Encounter for screening for malignant neoplasm of prostate: Secondary | ICD-10-CM | POA: Diagnosis not present

## 2021-12-31 DIAGNOSIS — Z1211 Encounter for screening for malignant neoplasm of colon: Secondary | ICD-10-CM | POA: Diagnosis not present

## 2022-02-25 ENCOUNTER — Ambulatory Visit: Payer: Medicare HMO

## 2022-02-25 ENCOUNTER — Other Ambulatory Visit: Payer: Medicare HMO

## 2022-05-03 DIAGNOSIS — I10 Essential (primary) hypertension: Secondary | ICD-10-CM | POA: Diagnosis not present

## 2022-05-03 DIAGNOSIS — R000 Tachycardia, unspecified: Secondary | ICD-10-CM | POA: Diagnosis not present

## 2022-07-07 DIAGNOSIS — R946 Abnormal results of thyroid function studies: Secondary | ICD-10-CM | POA: Diagnosis not present

## 2022-07-07 DIAGNOSIS — K219 Gastro-esophageal reflux disease without esophagitis: Secondary | ICD-10-CM | POA: Diagnosis not present

## 2022-07-07 DIAGNOSIS — N1831 Chronic kidney disease, stage 3a: Secondary | ICD-10-CM | POA: Diagnosis not present

## 2022-07-07 DIAGNOSIS — D582 Other hemoglobinopathies: Secondary | ICD-10-CM | POA: Diagnosis not present

## 2022-07-07 DIAGNOSIS — I10 Essential (primary) hypertension: Secondary | ICD-10-CM | POA: Diagnosis not present

## 2022-07-07 DIAGNOSIS — E782 Mixed hyperlipidemia: Secondary | ICD-10-CM | POA: Diagnosis not present

## 2022-07-07 DIAGNOSIS — M85851 Other specified disorders of bone density and structure, right thigh: Secondary | ICD-10-CM | POA: Diagnosis not present

## 2022-07-07 DIAGNOSIS — F419 Anxiety disorder, unspecified: Secondary | ICD-10-CM | POA: Diagnosis not present

## 2022-12-08 ENCOUNTER — Other Ambulatory Visit: Payer: Self-pay | Admitting: Family Medicine

## 2022-12-08 DIAGNOSIS — M85851 Other specified disorders of bone density and structure, right thigh: Secondary | ICD-10-CM

## 2023-01-08 DIAGNOSIS — G629 Polyneuropathy, unspecified: Secondary | ICD-10-CM | POA: Diagnosis not present

## 2023-01-08 DIAGNOSIS — I10 Essential (primary) hypertension: Secondary | ICD-10-CM | POA: Diagnosis not present

## 2023-01-08 DIAGNOSIS — Z1331 Encounter for screening for depression: Secondary | ICD-10-CM | POA: Diagnosis not present

## 2023-01-08 DIAGNOSIS — K219 Gastro-esophageal reflux disease without esophagitis: Secondary | ICD-10-CM | POA: Diagnosis not present

## 2023-01-08 DIAGNOSIS — F419 Anxiety disorder, unspecified: Secondary | ICD-10-CM | POA: Diagnosis not present

## 2023-01-08 DIAGNOSIS — N1831 Chronic kidney disease, stage 3a: Secondary | ICD-10-CM | POA: Diagnosis not present

## 2023-01-08 DIAGNOSIS — M85851 Other specified disorders of bone density and structure, right thigh: Secondary | ICD-10-CM | POA: Diagnosis not present

## 2023-01-08 DIAGNOSIS — E782 Mixed hyperlipidemia: Secondary | ICD-10-CM | POA: Diagnosis not present

## 2023-02-12 DIAGNOSIS — E782 Mixed hyperlipidemia: Secondary | ICD-10-CM | POA: Diagnosis not present

## 2023-02-12 DIAGNOSIS — Z125 Encounter for screening for malignant neoplasm of prostate: Secondary | ICD-10-CM | POA: Diagnosis not present

## 2023-02-12 DIAGNOSIS — M85851 Other specified disorders of bone density and structure, right thigh: Secondary | ICD-10-CM | POA: Diagnosis not present

## 2023-02-12 DIAGNOSIS — K219 Gastro-esophageal reflux disease without esophagitis: Secondary | ICD-10-CM | POA: Diagnosis not present

## 2023-02-12 DIAGNOSIS — Z1331 Encounter for screening for depression: Secondary | ICD-10-CM | POA: Diagnosis not present

## 2023-02-12 DIAGNOSIS — I10 Essential (primary) hypertension: Secondary | ICD-10-CM | POA: Diagnosis not present

## 2023-02-12 DIAGNOSIS — G629 Polyneuropathy, unspecified: Secondary | ICD-10-CM | POA: Diagnosis not present

## 2023-02-12 DIAGNOSIS — N1831 Chronic kidney disease, stage 3a: Secondary | ICD-10-CM | POA: Diagnosis not present

## 2023-02-12 DIAGNOSIS — Z0000 Encounter for general adult medical examination without abnormal findings: Secondary | ICD-10-CM | POA: Diagnosis not present

## 2023-02-12 DIAGNOSIS — F419 Anxiety disorder, unspecified: Secondary | ICD-10-CM | POA: Diagnosis not present

## 2023-02-13 ENCOUNTER — Other Ambulatory Visit: Payer: Self-pay | Admitting: Family Medicine

## 2023-02-13 DIAGNOSIS — Z136 Encounter for screening for cardiovascular disorders: Secondary | ICD-10-CM

## 2023-08-18 DIAGNOSIS — I10 Essential (primary) hypertension: Secondary | ICD-10-CM | POA: Diagnosis not present

## 2023-08-18 DIAGNOSIS — G629 Polyneuropathy, unspecified: Secondary | ICD-10-CM | POA: Diagnosis not present

## 2023-08-18 DIAGNOSIS — M85851 Other specified disorders of bone density and structure, right thigh: Secondary | ICD-10-CM | POA: Diagnosis not present

## 2023-08-18 DIAGNOSIS — K219 Gastro-esophageal reflux disease without esophagitis: Secondary | ICD-10-CM | POA: Diagnosis not present

## 2023-08-18 DIAGNOSIS — F419 Anxiety disorder, unspecified: Secondary | ICD-10-CM | POA: Diagnosis not present

## 2023-08-18 DIAGNOSIS — N1831 Chronic kidney disease, stage 3a: Secondary | ICD-10-CM | POA: Diagnosis not present

## 2023-08-18 DIAGNOSIS — E782 Mixed hyperlipidemia: Secondary | ICD-10-CM | POA: Diagnosis not present

## 2023-09-02 DIAGNOSIS — R944 Abnormal results of kidney function studies: Secondary | ICD-10-CM | POA: Diagnosis not present

## 2023-11-19 ENCOUNTER — Other Ambulatory Visit: Payer: Self-pay | Admitting: Family Medicine

## 2023-11-19 DIAGNOSIS — M85851 Other specified disorders of bone density and structure, right thigh: Secondary | ICD-10-CM

## 2023-12-11 ENCOUNTER — Ambulatory Visit
Admission: RE | Admit: 2023-12-11 | Discharge: 2023-12-11 | Disposition: A | Discharge status: Home / Self Care | Payer: Medicare HMO | Source: Ambulatory Visit | Attending: Family Medicine | Admitting: Family Medicine

## 2023-12-11 DIAGNOSIS — M85851 Other specified disorders of bone density and structure, right thigh: Secondary | ICD-10-CM

## 2023-12-11 DIAGNOSIS — M8588 Other specified disorders of bone density and structure, other site: Secondary | ICD-10-CM | POA: Diagnosis not present

## 2024-02-19 DIAGNOSIS — Z1331 Encounter for screening for depression: Secondary | ICD-10-CM | POA: Diagnosis not present

## 2024-02-19 DIAGNOSIS — M25511 Pain in right shoulder: Secondary | ICD-10-CM | POA: Diagnosis not present

## 2024-02-19 DIAGNOSIS — K219 Gastro-esophageal reflux disease without esophagitis: Secondary | ICD-10-CM | POA: Diagnosis not present

## 2024-02-19 DIAGNOSIS — I10 Essential (primary) hypertension: Secondary | ICD-10-CM | POA: Diagnosis not present

## 2024-02-19 DIAGNOSIS — M85851 Other specified disorders of bone density and structure, right thigh: Secondary | ICD-10-CM | POA: Diagnosis not present

## 2024-02-19 DIAGNOSIS — Z125 Encounter for screening for malignant neoplasm of prostate: Secondary | ICD-10-CM | POA: Diagnosis not present

## 2024-02-19 DIAGNOSIS — N1831 Chronic kidney disease, stage 3a: Secondary | ICD-10-CM | POA: Diagnosis not present

## 2024-02-19 DIAGNOSIS — F419 Anxiety disorder, unspecified: Secondary | ICD-10-CM | POA: Diagnosis not present

## 2024-02-19 DIAGNOSIS — Z0000 Encounter for general adult medical examination without abnormal findings: Secondary | ICD-10-CM | POA: Diagnosis not present

## 2024-02-19 DIAGNOSIS — E782 Mixed hyperlipidemia: Secondary | ICD-10-CM | POA: Diagnosis not present

## 2024-03-07 DIAGNOSIS — M25511 Pain in right shoulder: Secondary | ICD-10-CM | POA: Diagnosis not present

## 2024-03-08 DIAGNOSIS — D72829 Elevated white blood cell count, unspecified: Secondary | ICD-10-CM | POA: Diagnosis not present

## 2024-08-23 DIAGNOSIS — Z1211 Encounter for screening for malignant neoplasm of colon: Secondary | ICD-10-CM | POA: Diagnosis not present

## 2024-08-23 DIAGNOSIS — N1831 Chronic kidney disease, stage 3a: Secondary | ICD-10-CM | POA: Diagnosis not present

## 2024-08-23 DIAGNOSIS — M85851 Other specified disorders of bone density and structure, right thigh: Secondary | ICD-10-CM | POA: Diagnosis not present

## 2024-08-23 DIAGNOSIS — G629 Polyneuropathy, unspecified: Secondary | ICD-10-CM | POA: Diagnosis not present

## 2024-08-23 DIAGNOSIS — E782 Mixed hyperlipidemia: Secondary | ICD-10-CM | POA: Diagnosis not present

## 2024-08-23 DIAGNOSIS — F419 Anxiety disorder, unspecified: Secondary | ICD-10-CM | POA: Diagnosis not present

## 2024-08-23 DIAGNOSIS — Z9181 History of falling: Secondary | ICD-10-CM | POA: Diagnosis not present

## 2024-08-23 DIAGNOSIS — I10 Essential (primary) hypertension: Secondary | ICD-10-CM | POA: Diagnosis not present

## 2024-08-23 DIAGNOSIS — K219 Gastro-esophageal reflux disease without esophagitis: Secondary | ICD-10-CM | POA: Diagnosis not present
